# Patient Record
Sex: Female | Born: 1975 | Race: Black or African American | Hispanic: No | Marital: Single | Smoking: Former smoker
Health system: Southern US, Community
[De-identification: ages and names within clinical notes are randomized; demographics above are authoritative.]

## PROBLEM LIST (undated history)

## (undated) DIAGNOSIS — F419 Anxiety disorder, unspecified: Secondary | ICD-10-CM

## (undated) DIAGNOSIS — E7800 Pure hypercholesterolemia, unspecified: Secondary | ICD-10-CM

## (undated) DIAGNOSIS — D649 Anemia, unspecified: Secondary | ICD-10-CM

## (undated) DIAGNOSIS — E119 Type 2 diabetes mellitus without complications: Secondary | ICD-10-CM

## (undated) DIAGNOSIS — I10 Essential (primary) hypertension: Secondary | ICD-10-CM

## (undated) DIAGNOSIS — I82409 Acute embolism and thrombosis of unspecified deep veins of unspecified lower extremity: Secondary | ICD-10-CM

## (undated) HISTORY — DX: Pure hypercholesterolemia, unspecified: E78.00

## (undated) HISTORY — DX: Anemia, unspecified: D64.9

## (undated) HISTORY — DX: Acute embolism and thrombosis of unspecified deep veins of unspecified lower extremity: I82.409

## (undated) HISTORY — DX: Type 2 diabetes mellitus without complications: E11.9

## (undated) HISTORY — PX: TUBAL LIGATION: SHX77

## (undated) HISTORY — DX: Essential (primary) hypertension: I10

---

## 1997-01-27 HISTORY — PX: TUBAL LIGATION: SHX77

## 2009-10-22 ENCOUNTER — Emergency Department (HOSPITAL_COMMUNITY): Admission: EM | Admit: 2009-10-22 | Discharge: 2009-10-22 | Payer: Self-pay | Admitting: Emergency Medicine

## 2010-07-07 ENCOUNTER — Emergency Department (HOSPITAL_COMMUNITY): Payer: Self-pay

## 2010-07-07 ENCOUNTER — Emergency Department (HOSPITAL_COMMUNITY)
Admission: EM | Admit: 2010-07-07 | Discharge: 2010-07-07 | Disposition: A | Discharge status: Home / Self Care | Payer: Self-pay | Attending: Emergency Medicine | Admitting: Emergency Medicine

## 2010-07-07 DIAGNOSIS — N949 Unspecified condition associated with female genital organs and menstrual cycle: Secondary | ICD-10-CM | POA: Insufficient documentation

## 2010-07-07 DIAGNOSIS — R109 Unspecified abdominal pain: Secondary | ICD-10-CM | POA: Insufficient documentation

## 2010-07-07 LAB — URINALYSIS, ROUTINE W REFLEX MICROSCOPIC
Glucose, UA: NEGATIVE mg/dL
Hgb urine dipstick: NEGATIVE
Ketones, ur: NEGATIVE mg/dL
Protein, ur: NEGATIVE mg/dL
pH: 6.5 (ref 5.0–8.0)

## 2010-07-07 LAB — WET PREP, GENITAL

## 2010-07-09 LAB — GC/CHLAMYDIA PROBE AMP, GENITAL: GC Probe Amp, Genital: NEGATIVE

## 2011-12-30 ENCOUNTER — Ambulatory Visit: Payer: BC Managed Care – PPO

## 2011-12-30 ENCOUNTER — Ambulatory Visit: Payer: BC Managed Care – PPO | Admitting: Family Medicine

## 2011-12-30 VITALS — BP 120/85 | HR 81 | Temp 98.6°F | Resp 16 | Ht 62.25 in | Wt 190.4 lb

## 2011-12-30 DIAGNOSIS — M25532 Pain in left wrist: Secondary | ICD-10-CM

## 2011-12-30 DIAGNOSIS — M7989 Other specified soft tissue disorders: Secondary | ICD-10-CM

## 2011-12-30 DIAGNOSIS — M25539 Pain in unspecified wrist: Secondary | ICD-10-CM

## 2011-12-30 LAB — POCT CBC
Granulocyte percent: 57 % (ref 37–80)
HCT, POC: 46.4 % (ref 37.7–47.9)
Hemoglobin: 13.9 g/dL (ref 12.2–16.2)
Lymph, poc: 2.9 (ref 0.6–3.4)
MCH, POC: 27 pg (ref 27–31.2)
MCHC: 30 g/dL — AB (ref 31.8–35.4)
MCV: 90.2 fL (ref 80–97)
MID (cbc): 0.5 (ref 0–0.9)
MPV: 9.5 fL (ref 0–99.8)
POC Granulocyte: 4.5 (ref 2–6.9)
POC LYMPH PERCENT: 36.9 % (ref 10–50)
POC MID %: 6.1 % (ref 0–12)
Platelet Count, POC: 243 10*3/uL (ref 142–424)
RBC: 5.14 M/uL (ref 4.04–5.48)
RDW, POC: 14.6 %
WBC: 7.9 10*3/uL (ref 4.6–10.2)

## 2011-12-30 MED ORDER — IBUPROFEN 800 MG PO TABS: 800.0000 mg | ORAL_TABLET | Freq: Three times a day (TID) | ORAL | Status: DC | PRN

## 2011-12-30 MED ORDER — DOXYCYCLINE HYCLATE 100 MG PO TABS: 100.0000 mg | ORAL_TABLET | Freq: Two times a day (BID) | ORAL | Status: DC

## 2011-12-30 MED ORDER — TRAMADOL HCL 50 MG PO TABS: 50.0000 mg | ORAL_TABLET | Freq: Four times a day (QID) | ORAL | Status: DC | PRN

## 2011-12-30 NOTE — Progress Notes (Signed)
[facility]:  Office Visit  Chief Complaint:  Chief Complaint  Patient presents with  . Wrist Pain    left forearm and wrist pain started yesterday also swelling denies any injury    HPI: Barbara Thornton is a 36 y.o. female who complains of  Left wrist and forearm pain, swelling x 1 day; woke up this AM with pain, started yesterday, took 2 ibuprofen without relief.  NKI, denies insect bites, gout, arthritis, Lupus, recent URI/illness. Sleeps with wrist angulated at night. Denies Carpal Tunnel. No fevers, chills. +Warm, swollen.  History reviewed. No pertinent past medical history. Past Surgical History  Procedure Date  . Cesarean section    History   Social History  . Marital Status: Married    Spouse Name: N/A    Number of Children: N/A  . Years of Education: N/A   Social History Main Topics  . Smoking status: Current Every Day Smoker -- 0.5 packs/day for 17 years  . Smokeless tobacco: None  . Alcohol Use: Yes  . Drug Use: No  . Sexually Active: None   Other Topics Concern  . None   Social History Narrative  . None   Family History  Problem Relation Age of Onset  . Hypertension Mother    No Known Allergies Prior to Admission medications   Medication Sig Start Date End Date Taking? Authorizing Provider  ibuprofen (ADVIL,MOTRIN) 200 MG tablet Take 200 mg by mouth every 6 (six) hours as needed.   Yes Historical Provider, MD     ROS: The patient denies fevers, chills, night sweats, unintentional weight loss, chest pain, palpitations, wheezing, dyspnea on exertion, nausea, vomiting, abdominal pain, dysuria, hematuria, melena, numbness, weakness, or tingling.   All other systems have been reviewed and were otherwise negative with the exception of those mentioned in the HPI and as above.    PHYSICAL EXAM: Filed Vitals:   12/30/11 1346  BP: 120/85  Pulse: 81  Temp: 98.6 F (37 C)  Resp: 16   Filed Vitals:   12/30/11 1346  Height: 5' 2.25"  (1.581 m)  Weight: 190 lb 6.4 oz (86.365 kg)   Body mass index is 34.55 kg/(m^2).  General: Alert, no acute distress HEENT:  Normocephalic, atraumatic, oropharynx patent.  Cardiovascular:  Regular rate and rhythm, no rubs murmurs or gallops.  No Carotid bruits, radial pulse intact. No pedal edema.  Respiratory: Clear to auscultation bilaterally.  No wheezes, rales, or rhonchi.  No cyanosis, no use of accessory musculature GI: No organomegaly, abdomen is soft and non-tender, positive bowel sounds.  No masses. Skin: No rashes. Neurologic: Facial musculature symmetric. Psychiatric: Patient is appropriate throughout our interaction. Lymphatic: No cervical lymphadenopathy Musculoskeletal: Gait intact. Left wrist- swollen , warm, + radial artery, pain with ROM in all directions  5/5 strength, sensation intact, swelling on dorsum of wrist base of thumb over MCP   LABS: Results for orders placed in visit on 12/30/11  POCT CBC      Component Value Range   WBC 7.9  4.6 - 10.2 K/uL   Lymph, poc 2.9  0.6 - 3.4   POC LYMPH PERCENT 36.9  10 - 50 %L   MID (cbc) 0.5  0 - 0.9   POC MID % 6.1  0 - 12 %M   POC Granulocyte 4.5  2 - 6.9   Granulocyte percent 57.0  37 - 80 %G   RBC 5.14  4.04 - 5.48 M/uL   Hemoglobin 13.9  12.2 -  16.2 g/dL   HCT, POC 95.6  21.3 - 47.9 %   MCV 90.2  80 - 97 fL   MCH, POC 27.0  27 - 31.2 pg   MCHC 30.0 (*) 31.8 - 35.4 g/dL   RDW, POC 08.6     Platelet Count, POC 243  142 - 424 K/uL   MPV 9.5  0 - 99.8 fL     EKG/XRAY:   Primary read interpreted by Dr. Conley Rolls at [facility]. No fx/subluxation + soft tissue swelling   ASSESSMENT/PLAN: Encounter Diagnoses  Name Primary?  . Left wrist pain Yes  . Left arm swelling    Cellulitis vs tenosynovitis vs reactive arthritis vs less likely septic joint Rx thumb spica wrist splint Rx Doxycycline for possible cellulitis prevention Rx Ibuprofen and Tramadol prn swelling, pain F/u in 48 hrs or prn    LE, THAO PHUONG,  DO 12/30/2011 4:03 PM

## 2013-01-19 ENCOUNTER — Encounter (HOSPITAL_COMMUNITY): Payer: Self-pay | Admitting: Emergency Medicine

## 2013-01-19 ENCOUNTER — Emergency Department (HOSPITAL_COMMUNITY)
Admission: EM | Admit: 2013-01-19 | Discharge: 2013-01-20 | Disposition: A | Discharge status: Home / Self Care | Payer: BC Managed Care – PPO | Attending: Emergency Medicine | Admitting: Emergency Medicine

## 2013-01-19 DIAGNOSIS — M7989 Other specified soft tissue disorders: Secondary | ICD-10-CM | POA: Insufficient documentation

## 2013-01-19 DIAGNOSIS — F17200 Nicotine dependence, unspecified, uncomplicated: Secondary | ICD-10-CM | POA: Insufficient documentation

## 2013-01-19 DIAGNOSIS — M79671 Pain in right foot: Secondary | ICD-10-CM

## 2013-01-19 DIAGNOSIS — M25579 Pain in unspecified ankle and joints of unspecified foot: Secondary | ICD-10-CM | POA: Insufficient documentation

## 2013-01-19 NOTE — ED Notes (Signed)
Pt arrived to the ED with a complaint of right foot pain.  Pt states pain and swelling began around 1645 this evening.  Pt states she did noting to cause the pain and swelling.  Pt's foot slightly swollen.

## 2013-01-20 ENCOUNTER — Emergency Department (HOSPITAL_COMMUNITY): Payer: BC Managed Care – PPO

## 2013-01-20 LAB — CBC WITH DIFFERENTIAL/PLATELET
Basophils Absolute: 0 10*3/uL (ref 0.0–0.1)
Eosinophils Relative: 1 % (ref 0–5)
HCT: 36.8 % (ref 36.0–46.0)
Hemoglobin: 12.4 g/dL (ref 12.0–15.0)
Lymphocytes Relative: 32 % (ref 12–46)
MCV: 84.2 fL (ref 78.0–100.0)
Monocytes Absolute: 0.6 10*3/uL (ref 0.1–1.0)
Monocytes Relative: 7 % (ref 3–12)
Neutro Abs: 5.1 10*3/uL (ref 1.7–7.7)
RDW: 14.3 % (ref 11.5–15.5)
WBC: 8.5 10*3/uL (ref 4.0–10.5)

## 2013-01-20 LAB — COMPREHENSIVE METABOLIC PANEL
BUN: 12 mg/dL (ref 6–23)
CO2: 25 mEq/L (ref 19–32)
Calcium: 9.4 mg/dL (ref 8.4–10.5)
Chloride: 102 mEq/L (ref 96–112)
Creatinine, Ser: 0.67 mg/dL (ref 0.50–1.10)
GFR calc Af Amer: >90 mL/min (ref >90)
GFR calc non Af Amer: >90 mL/min (ref >90)
Glucose, Bld: 98 mg/dL (ref 70–99)
Total Bilirubin: 0.6 mg/dL (ref 0.3–1.2)

## 2013-01-20 LAB — URIC ACID: Uric Acid, Serum: 6 mg/dL (ref 2.4–7.0)

## 2013-01-20 MED ORDER — HYDROCODONE-ACETAMINOPHEN 5-325 MG PO TABS: 1.0000 | ORAL_TABLET | ORAL | Status: DC | PRN

## 2013-01-20 MED ORDER — CLINDAMYCIN HCL 300 MG PO CAPS
300.0000 mg | ORAL_CAPSULE | Freq: Once | ORAL | Status: AC
  Administered 2013-01-20: 300 mg via ORAL
  Filled 2013-01-20: qty 1

## 2013-01-20 MED ORDER — KETOROLAC TROMETHAMINE 60 MG/2ML IM SOLN
60.0000 mg | Freq: Once | INTRAMUSCULAR | Status: AC
  Administered 2013-01-20: 60 mg via INTRAMUSCULAR
  Filled 2013-01-20: qty 2

## 2013-01-20 MED ORDER — IBUPROFEN 600 MG PO TABS: 600.0000 mg | ORAL_TABLET | Freq: Four times a day (QID) | ORAL | Status: DC | PRN

## 2013-01-20 MED ORDER — CLINDAMYCIN HCL 300 MG PO CAPS: 300.0000 mg | ORAL_CAPSULE | Freq: Four times a day (QID) | ORAL | Status: DC

## 2013-01-20 NOTE — ED Provider Notes (Signed)
CSN: [id number]     Arrival date & time 01/19/13  2252 History   First MD Initiated Contact with Patient 01/20/13 0205     Chief Complaint  Patient presents with  . Extremity Pain   (Consider location/radiation/quality/duration/timing/severity/associated sxs/prior Treatment) HPI Patient presents with right foot pain that began around 445 this past evening the patient had no known trauma. She's had increased swelling, warmth and tenderness to the dorsum of the foot. She's had no calf tenderness or swelling. She denies any red streaking or groin pain. He's had no fevers or chills. Patient has no previous similar episodes History reviewed. No pertinent past medical history. Past Surgical History  Procedure Laterality Date  . Cesarean section     Family History  Problem Relation Age of Onset  . Hypertension Mother    History  Substance Use Topics  . Smoking status: Current Every Day Smoker -- 0.50 packs/day for 17 years  . Smokeless tobacco: Not on file  . Alcohol Use: Yes   OB History   Grav Para Term Preterm Abortions TAB SAB Ect Mult Living                 Review of Systems  Constitutional: Negative for fever and chills.  Respiratory: Negative for shortness of breath.   Cardiovascular: Positive for leg swelling. Negative for chest pain.  Gastrointestinal: Negative for nausea, vomiting, abdominal pain, diarrhea and constipation.  Musculoskeletal: Negative for back pain, neck pain and neck stiffness.  Skin: Positive for color change. Negative for rash and wound.  Neurological: Negative for dizziness, syncope, weakness, light-headedness, numbness and headaches.  All other systems reviewed and are negative.    Allergies  Review of patient's allergies indicates no known allergies.  Home Medications   Current Outpatient Rx  Name  Route  Sig  Dispense  Refill  . clindamycin (CLEOCIN) 300 MG capsule   Oral   Take 1 capsule (300 mg total) by mouth 4 (four) times daily. X 7  days   28 capsule   0   . HYDROcodone-acetaminophen (NORCO) 5-325 MG per tablet   Oral   Take 1 tablet by mouth every 4 (four) hours as needed.   20 tablet   0   . ibuprofen (ADVIL,MOTRIN) 600 MG tablet   Oral   Take 1 tablet (600 mg total) by mouth every 6 (six) hours as needed.   30 tablet   0    BP 135/79  Pulse 76  Temp(Src) 98.5 F (36.9 C) (Oral)  Resp 16  Ht 5\' 2"  (1.575 m)  SpO2 100%  LMP 01/19/2013 Physical Exam  Nursing note and vitals reviewed. Constitutional: She is oriented to person, place, and time. She appears well-developed and well-nourished. No distress.  HENT:  Head: Normocephalic and atraumatic.  Mouth/Throat: Oropharynx is clear and moist.  Eyes: EOM are normal. Pupils are equal, round, and reactive to light.  Neck: Normal range of motion. Neck supple.  Cardiovascular: Normal rate and regular rhythm.   Pulmonary/Chest: Effort normal and breath sounds normal. No respiratory distress. She has no wheezes. She has no rales. She exhibits no tenderness.  Abdominal: Soft. Bowel sounds are normal. She exhibits no distension and no mass. There is no tenderness. There is no rebound and no guarding.  Musculoskeletal: Normal range of motion. She exhibits tenderness. She exhibits no edema.  Swelling, erythema and warmth to the dorsum of the right foot which is also diffusely tender in this area. No open wounds identified. No definite  tenderness in the ankle or metatarsal joints  Neurological: She is alert and oriented to person, place, and time.  Patient is alert and oriented x3 with clear, goal oriented speech. Patient has 5/5 motor in all extremities. Sensation is intact to light touch.  Skin: Skin is warm and dry. No rash noted. There is erythema.  Psychiatric: She has a normal mood and affect. Her behavior is normal.    ED Course  Procedures (including critical care time) Labs Review Labs Reviewed  CBC WITH DIFFERENTIAL  COMPREHENSIVE METABOLIC PANEL   URIC ACID   Imaging Review Dg Foot Complete Right  01/20/2013   CLINICAL DATA:  Pain. Redness and swelling on the anterior aspect of foot.  EXAM: RIGHT FOOT COMPLETE - 3+ VIEW  COMPARISON:  None.  FINDINGS: There is no evidence of fracture or dislocation. No focal osseous lesion. Degenerative spurring present at the dorsal aspect of the talonavicular joint. Calcaneal enthesophyte is noted.  There is mild soft tissue swelling at the dorsal aspect of the right midfoot. No emphysema or radiopaque foreign body.  IMPRESSION: Mild soft tissue swelling at the dorsal aspect of the right midfoot. No acute osseous abnormality. No soft tissue emphysema radiopaque foreign body.   Electronically Signed   By: Rise Mu M.D.   On: 01/20/2013 03:28    EKG Interpretation   None       MDM   Question of acute inflammation versus infectious process. Patient has no constitutional symptoms. White blood cell count is normal. Will start on clindamycin have patient followup in 24-48 hours. She's been given return precautions to return immediately for fever, worsening pain, increased swelling up the leg or for any concerns.   Loren Racer, MD 01/21/13 [phone]

## 2013-01-20 NOTE — ED Notes (Signed)
MD at bedside. 

## 2014-11-28 ENCOUNTER — Ambulatory Visit (INDEPENDENT_AMBULATORY_CARE_PROVIDER_SITE_OTHER): Payer: BLUE CROSS/BLUE SHIELD | Admitting: Emergency Medicine

## 2014-11-28 VITALS — BP 116/86 | HR 83 | Temp 98.7°F | Resp 18 | Ht 62.5 in | Wt 196.2 lb

## 2014-11-28 DIAGNOSIS — J020 Streptococcal pharyngitis: Secondary | ICD-10-CM | POA: Diagnosis not present

## 2014-11-28 MED ORDER — HYDROCODONE-ACETAMINOPHEN 5-325 MG PO TABS: 1.0000 | ORAL_TABLET | ORAL | Status: DC | PRN

## 2014-11-28 MED ORDER — PENICILLIN V POTASSIUM 500 MG PO TABS: 500.0000 mg | ORAL_TABLET | Freq: Four times a day (QID) | ORAL | Status: DC

## 2014-11-28 NOTE — Patient Instructions (Signed)

## 2014-11-28 NOTE — Progress Notes (Signed)
Subjective:  Patient ID: Barbara Thornton, female    DOB: 14-May-1975  Age: 39 y.o. MRN: [id number]  CC: Sore Throat   HPI Barbara Thornton presents  patient has a sore throat fever and occasional cough. Barbara Thornton has no nasal congestion postnasal drainage or cough is nonproductive and there is no associated wheezing or shortness of breath. Her sore throat began yesterday. One of her coworkers also ill with similar symptoms.  History Barbara Thornton has no past medical history on file.   Barbara Thornton has past surgical history that includes Cesarean section.   Her  family history includes Hypertension in her mother.  Barbara Thornton   reports that Barbara Thornton has quit smoking. Barbara Thornton quit smokeless tobacco use about 2 years ago. Barbara Thornton reports that Barbara Thornton drinks alcohol. Barbara Thornton reports that Barbara Thornton does not use illicit drugs.  Outpatient Prescriptions Prior to Visit  Medication Sig Dispense Refill  . clindamycin (CLEOCIN) 300 MG capsule Take 1 capsule (300 mg total) by mouth 4 (four) times daily. X 7 days 28 capsule 0  . HYDROcodone-acetaminophen (NORCO) 5-325 MG per tablet Take 1 tablet by mouth every 4 (four) hours as needed. 20 tablet 0  . ibuprofen (ADVIL,MOTRIN) 600 MG tablet Take 1 tablet (600 mg total) by mouth every 6 (six) hours as needed. 30 tablet 0   No facility-administered medications prior to visit.    Social History   Social History  . Marital Status: Married    Spouse Name: N/A  . Number of Children: N/A  . Years of Education: N/A   Social History Main Topics  . Smoking status: Former Smoker -- 0.50 packs/day for 17 years  . Smokeless tobacco: Former Systems developer    Quit date: 11/27/2012  . Alcohol Use: 0.0 oz/week    0 Standard drinks or equivalent per week  . Drug Use: No  . Sexual Activity: Not Asked   Other Topics Concern  . None   Social History Narrative     Review of Systems  Constitutional: Positive for fever. Negative for chills and appetite change.  HENT: Positive for sore throat. Negative for  congestion, ear pain, postnasal drip and sinus pressure.   Eyes: Negative for pain and redness.  Respiratory: Positive for cough. Negative for shortness of breath and wheezing.   Cardiovascular: Negative for leg swelling.  Gastrointestinal: Negative for nausea, vomiting, abdominal pain, diarrhea, constipation and blood in stool.  Endocrine: Negative for polyuria.  Genitourinary: Negative for dysuria, urgency, frequency and flank pain.  Musculoskeletal: Negative for gait problem.  Skin: Negative for rash.  Neurological: Negative for weakness and headaches.  Psychiatric/Behavioral: Negative for confusion and decreased concentration. The patient is not nervous/anxious.     Objective:  BP 116/86 mmHg  Pulse 83  Temp(Src) 98.7 F (37.1 C) (Oral)  Resp 18  Ht 5' 2.5" (1.588 m)  Wt 196 lb 3.2 oz (88.996 kg)  BMI 35.29 kg/m2  SpO2 99%  Physical Exam  Constitutional: Barbara Thornton is oriented to person, place, and time. Barbara Thornton appears well-developed and well-nourished. No distress.  HENT:  Head: Normocephalic and atraumatic.  Right Ear: External ear normal.  Left Ear: External ear normal.  Nose: Nose normal.  Mouth/Throat: Oropharyngeal exudate, posterior oropharyngeal edema and posterior oropharyngeal erythema present.  Eyes: Conjunctivae and EOM are normal. Pupils are equal, round, and reactive to light. No scleral icterus.  Neck: Normal range of motion. Neck supple. No tracheal deviation present.  Cardiovascular: Normal rate, regular rhythm and normal heart sounds.   Pulmonary/Chest: Effort normal. No  respiratory distress. Barbara Thornton has no wheezes. Barbara Thornton has no rales.  Abdominal: Barbara Thornton exhibits no mass. There is no tenderness. There is no rebound and no guarding.  Musculoskeletal: Barbara Thornton exhibits no edema.  Lymphadenopathy:    Barbara Thornton has no cervical adenopathy.  Neurological: Barbara Thornton is alert and oriented to person, place, and time. Coordination normal.  Skin: Skin is warm and dry. No rash noted.  Psychiatric:  Barbara Thornton has a normal mood and affect. Her behavior is normal.      Assessment & Plan:   Barbara Thornton was seen today for sore throat.  Diagnoses and all orders for this visit:  Streptococcal sore throat  Other orders -     penicillin v potassium (VEETID) 500 MG tablet; Take 1 tablet (500 mg total) by mouth 4 (four) times daily. -     HYDROcodone-acetaminophen (NORCO) 5-325 MG tablet; Take 1-2 tablets by mouth every 4 (four) hours as needed.  I have discontinued Barbara Thornton ibuprofen, HYDROcodone-acetaminophen, and clindamycin. I am also having her start on penicillin v potassium and HYDROcodone-acetaminophen.  Meds ordered this encounter  Medications  . penicillin v potassium (VEETID) 500 MG tablet    Sig: Take 1 tablet (500 mg total) by mouth 4 (four) times daily.    Dispense:  40 tablet    Refill:  0  . HYDROcodone-acetaminophen (NORCO) 5-325 MG tablet    Sig: Take 1-2 tablets by mouth every 4 (four) hours as needed.    Dispense:  30 tablet    Refill:  0    Appropriate red flag conditions were discussed with the patient as well as actions that should be taken.  Patient expressed his understanding.  Follow-up: Return if symptoms worsen or fail to improve.  Roselee Culver, MD

## 2015-02-27 ENCOUNTER — Encounter: Payer: BLUE CROSS/BLUE SHIELD | Admitting: Obstetrics and Gynecology

## 2015-03-07 ENCOUNTER — Telehealth: Payer: Self-pay | Admitting: Obstetrics and Gynecology

## 2015-03-07 ENCOUNTER — Ambulatory Visit (INDEPENDENT_AMBULATORY_CARE_PROVIDER_SITE_OTHER): Payer: BLUE CROSS/BLUE SHIELD | Admitting: Obstetrics and Gynecology

## 2015-03-07 ENCOUNTER — Encounter: Payer: Self-pay | Admitting: Obstetrics and Gynecology

## 2015-03-07 VITALS — BP 138/80 | HR 80 | Resp 14 | Ht 62.5 in | Wt 195.0 lb

## 2015-03-07 DIAGNOSIS — Z23 Encounter for immunization: Secondary | ICD-10-CM

## 2015-03-07 DIAGNOSIS — Z0000 Encounter for general adult medical examination without abnormal findings: Secondary | ICD-10-CM

## 2015-03-07 DIAGNOSIS — Z9289 Personal history of other medical treatment: Secondary | ICD-10-CM

## 2015-03-07 DIAGNOSIS — Z01419 Encounter for gynecological examination (general) (routine) without abnormal findings: Secondary | ICD-10-CM | POA: Diagnosis not present

## 2015-03-07 DIAGNOSIS — N926 Irregular menstruation, unspecified: Secondary | ICD-10-CM | POA: Diagnosis not present

## 2015-03-07 DIAGNOSIS — Z9189 Other specified personal risk factors, not elsewhere classified: Secondary | ICD-10-CM

## 2015-03-07 DIAGNOSIS — Z113 Encounter for screening for infections with a predominantly sexual mode of transmission: Secondary | ICD-10-CM | POA: Diagnosis not present

## 2015-03-07 DIAGNOSIS — Z124 Encounter for screening for malignant neoplasm of cervix: Secondary | ICD-10-CM

## 2015-03-07 DIAGNOSIS — Z2839 Other underimmunization status: Secondary | ICD-10-CM

## 2015-03-07 LAB — COMPREHENSIVE METABOLIC PANEL
ALBUMIN: 3.9 g/dL (ref 3.6–5.1)
ALK PHOS: 46 U/L (ref 33–115)
ALT: 12 U/L (ref 6–29)
AST: 14 U/L (ref 10–30)
BUN: 11 mg/dL (ref 7–25)
CO2: 22 mmol/L (ref 20–31)
Calcium: 9.5 mg/dL (ref 8.6–10.2)
Chloride: 105 mmol/L (ref 98–110)
Creat: 0.74 mg/dL (ref 0.50–1.10)
Glucose, Bld: 93 mg/dL (ref 65–99)
POTASSIUM: 4.6 mmol/L (ref 3.5–5.3)
Sodium: 139 mmol/L (ref 135–146)
TOTAL PROTEIN: 7.4 g/dL (ref 6.1–8.1)
Total Bilirubin: 0.5 mg/dL (ref 0.2–1.2)

## 2015-03-07 LAB — LIPID PANEL
CHOLESTEROL: 133 mg/dL (ref 125–200)
HDL: 53 mg/dL (ref >=46)
LDL CALC: 65 mg/dL (ref <130)
TRIGLYCERIDES: 75 mg/dL (ref <150)
Total CHOL/HDL Ratio: 2.5 Ratio (ref <=5.0)
VLDL: 15 mg/dL (ref <30)

## 2015-03-07 LAB — CBC
HCT: 36.7 % (ref 36.0–46.0)
HEMOGLOBIN: 11.6 g/dL — AB (ref 12.0–15.0)
MCH: 26.3 pg (ref 26.0–34.0)
MCHC: 31.6 g/dL (ref 30.0–36.0)
MCV: 83.2 fL (ref 78.0–100.0)
MPV: 10 fL (ref 8.6–12.4)
Platelets: 303 10*3/uL (ref 150–400)
RBC: 4.41 MIL/uL (ref 3.87–5.11)
RDW: 14.9 % (ref 11.5–15.5)
WBC: 5.4 10*3/uL (ref 4.0–10.5)

## 2015-03-07 LAB — TSH: TSH: 1.59 m[IU]/L

## 2015-03-07 NOTE — Patient Instructions (Signed)

## 2015-03-07 NOTE — Addendum Note (Signed)
Addended by: Dorothy Spark on: 03/07/2015 02:10 PM   Modules accepted: Orders

## 2015-03-07 NOTE — Progress Notes (Addendum)
Patient ID: Barbara Thornton, female   DOB: January 06, 1976, 40 y.o.   MRN: [id number] 40 y.o. [id number] MarriedAfrican AmericanF here for annual exam. Last menstrual cycle was different and last longer which concerned patient. Period Cycle (Days): 28 Period Duration (Days): 4 days  Period Pattern: Regular Menstrual Flow: Moderate Menstrual Control: Tampon Dysmenorrhea: (!) Mild Dysmenorrhea Symptoms: Cramping  Saturates a super tampon in 4 hours. Cramps are mild to moderate. Not taking medication. Her last cycle lasted 7 days, spotting for the last 3 days, had an odor. No longer having an odor.  Sexually active, no pain. TL for contraception.   Patient's last menstrual period was 02/12/2015.          Sexually active: Yes.    The current method of family planning is tubal ligation.    Exercising: Yes.    cardio Smoker:  former  Health Maintenance: Pap:  10 years ago  History of abnormal Pap:  no MMG:  Never Colonoscopy:  Never BMD:   Never TDaP:  unsure Gardasil: N/A   reports that she has quit smoking. She quit smokeless tobacco use about 2 years ago. She reports that she drinks alcohol. She reports that she does not use illicit drugs.Daughters are 78 and 48. She is in the process of divorce and is in another relationship. She is a Public relations account executive.   History reviewed. No pertinent past medical history.  Past Surgical History  Procedure Laterality Date  . Cesarean section    . Tubal ligation    1 vaginal delivery, h/o ectopic, tube removed, c/s x 1 with tubal (of the remaining tube)  No current outpatient prescriptions on file.   No current facility-administered medications for this visit.    Family History  Problem Relation Age of Onset  . Hypertension Mother     Review of Systems  Constitutional: Negative.   HENT: Negative.   Eyes: Negative.   Respiratory: Negative.   Cardiovascular: Negative.   Gastrointestinal: Negative.   Endocrine: Negative.   Genitourinary:  Negative.        Irregular menstrual cycle with last cycle  Musculoskeletal: Negative.   Skin: Negative.   Allergic/Immunologic: Negative.   Neurological: Negative.   Psychiatric/Behavioral: Negative.     Exam:   BP 138/80 mmHg  Pulse 80  Resp 14  Ht 5' 2.5" (1.588 m)  Wt 195 lb (88.451 kg)  BMI 35.08 kg/m2  LMP 02/12/2015  Weight change: @WEIGHTCHANGE @ Height:   Height: 5' 2.5" (158.8 cm)  Ht Readings from Last 3 Encounters:  03/07/15 5' 2.5" (1.588 m)  11/28/14 5' 2.5" (1.588 m)  01/19/13 5\' 2"  (1.575 m)    General appearance: alert, cooperative and appears stated age Head: Normocephalic, without obvious abnormality, atraumatic Neck: no adenopathy, supple, symmetrical, trachea midline and thyroid normal to inspection and palpation Lungs: clear to auscultation bilaterally Breasts: normal appearance, no masses or tenderness Heart: regular rate and rhythm Abdomen: soft, non-tender; bowel sounds normal; no masses,  no organomegaly Extremities: extremities normal, atraumatic, no cyanosis or edema Skin: Skin color, texture, turgor normal. No rashes or lesions Lymph nodes: Cervical, supraclavicular, and axillary nodes normal. No abnormal inguinal nodes palpated Neurologic: Grossly normal   Pelvic: External genitalia:  no lesions              Urethra:  normal appearing urethra with no masses, tenderness or lesions              Bartholins and Skenes: normal  Vagina: normal appearing vagina with normal color and discharge, no lesions              Cervix: no lesions               Bimanual Exam:  Uterus:  normal size, contour, position, consistency, mobility, non-tender, anteverted              Adnexa: no mass, fullness, tenderness               Rectovaginal: Confirms               Anus:  normal sphincter tone, no lesions  Chaperone was present for exam.  A:  Well Woman with normal exam  One abnormally long cycle, lasted x 7 days.  P:    Pap with hpv  TSH,  CBC, CMP, lipids, vit D  Mammogram after she turns 40  Calendar cycles, discussed when to call  Discussed calcium, vit D and breast self exams  TDAP  Addendum: patient desires STD testing, added.

## 2015-03-07 NOTE — Telephone Encounter (Signed)
Called patient, left a message for her to call

## 2015-03-08 LAB — STD PANEL
HEP B S AG: NEGATIVE
HIV: NONREACTIVE

## 2015-03-08 LAB — VITAMIN D 25 HYDROXY (VIT D DEFICIENCY, FRACTURES): VIT D 25 HYDROXY: 11 ng/mL — AB (ref 30–100)

## 2015-03-08 LAB — HEPATITIS C ANTIBODY: HCV AB: NEGATIVE

## 2015-03-09 ENCOUNTER — Telehealth: Payer: Self-pay

## 2015-03-09 DIAGNOSIS — R7989 Other specified abnormal findings of blood chemistry: Secondary | ICD-10-CM

## 2015-03-09 DIAGNOSIS — E611 Iron deficiency: Secondary | ICD-10-CM

## 2015-03-09 MED ORDER — VITAMIN D (ERGOCALCIFEROL) 1.25 MG (50000 UNIT) PO CAPS: 50000.0000 [IU] | ORAL_CAPSULE | ORAL | Status: DC

## 2015-03-09 NOTE — Telephone Encounter (Signed)
-----   Message from [name], MD sent at 03/09/2015  9:10 AM EST ----- Please inform the patient that her vit d level is very low and she is mildly anemic. Please call in 50,000 IU po q week x 12 weeks for the patient and ask her to start on one iron tab a day (ie slow fe or ferrous gluconate 324-325 mg a day). Then she needs a f/u vit d, cbc and ferritin in 3 months (please set up). Her pap and genprobe are pending.

## 2015-03-09 NOTE — Addendum Note (Signed)
Addended by: Jasmine Awe on: 03/09/2015 02:07 PM   Modules accepted: Orders

## 2015-03-09 NOTE — Telephone Encounter (Signed)
Spoke with patient. Advised of results as seen below from [place]. She is agreeable and verbalizes understanding. Rx for Vitamin D 50,000 IU take 1 tablet every 7 days x 12 weeks #12 0RF sent to pharmacy on file. Follow up lab appointment scheduled for 06/14/2015 at 8:30 am. She is agreeable to date and time. Future orders placed for vitamin d, cbc, and ferritin level.  Routing to provider for final review. Patient agreeable to disposition. Will close encounter.

## 2015-03-12 LAB — IPS PAP TEST WITH HPV

## 2015-03-12 LAB — IPS N GONORRHOEA AND CHLAMYDIA BY PCR

## 2015-03-14 ENCOUNTER — Telehealth: Payer: Self-pay

## 2015-03-14 NOTE — Telephone Encounter (Signed)
Patient returning call.  Ok to leave a detailed message.

## 2015-03-14 NOTE — Telephone Encounter (Signed)
Detailed message left at phone number provided [phone], okay per ROI. Advised of results as seen below from [place]. Advised to return call to discuss if she is or is not having any symptoms to determine if treatment is needed. If she is symptomatic would she like to have Flagyl or Metrogel?

## 2015-03-14 NOTE — Telephone Encounter (Signed)
Spoke with patient. Advised of results as seen below from [place]. She denies any current vaginal symptoms. Advised no treatment is needed at this time. If she develops and symptoms she will need to contact the office for rx. She is agreeable. States she has a "knot" under the skin where she received her Tdap vaccination while in the office. Denies any rash, tightness in the arm, or arm swelling. Advised "knot" can be from penetration of the skin from the injection. Advised if she develops any new symptoms such as arm swelling, tightness, rash, SOB, will need to notify the office immediately. She is agreeable.  Routing to provider for final review. Patient agreeable to disposition. Will close encounter.

## 2015-03-14 NOTE — Telephone Encounter (Signed)
-----   Message from [name], MD sent at 03/14/2015 10:14 AM EST ----- Please also inform of negative GC/Chlamydia testing

## 2015-03-14 NOTE — Telephone Encounter (Signed)
Left message to call [place] at [phone].  Notes Recorded by [name], MD on 03/14/2015 at 10:13 AM 02 Please inform the patient that her pap was + for BV and only if she is symptomatic, treat with flagyl (either oral or vaginal, her choice), no ETOH while on Flagyl.  Oral: Flagyl 500 mg BID x 7 days, or Vaginal: Metrogel, 1 applicator per vagina q day x 5 days.

## 2015-05-26 ENCOUNTER — Other Ambulatory Visit: Payer: Self-pay | Admitting: Obstetrics and Gynecology

## 2015-05-28 NOTE — Telephone Encounter (Signed)
Medication refill request: Vitamin D 50,000 iu's Last AEX:  03/07/15 with JJ Next AEX: 03/12/16 with JJ Last Vitamin D level: 03/07/15 at 11 Refill authorized: Please advise  According to last vitamin d level checked patient was to start on vitamin d 50,000 iu's x12 weeks and then have a 3 month recheck. Patient is scheduled for vitamin d, cbc and ferritin recheck 06/14/15, please advise.

## 2015-05-28 NOTE — Telephone Encounter (Signed)
Vitamin D 50,000 iu's #4/0 rfs sent to [facility].

## 2015-05-28 NOTE — Telephone Encounter (Signed)
Please give her a one month refill

## 2015-06-14 ENCOUNTER — Other Ambulatory Visit (INDEPENDENT_AMBULATORY_CARE_PROVIDER_SITE_OTHER): Payer: BLUE CROSS/BLUE SHIELD

## 2015-06-14 DIAGNOSIS — E611 Iron deficiency: Secondary | ICD-10-CM

## 2015-06-14 DIAGNOSIS — R7989 Other specified abnormal findings of blood chemistry: Secondary | ICD-10-CM

## 2015-06-14 LAB — CBC WITH DIFFERENTIAL/PLATELET
BASOS ABS: 0 {cells}/uL (ref 0–200)
Basophils Relative: 0 %
EOS ABS: 65 {cells}/uL (ref 15–500)
Eosinophils Relative: 1 %
HEMATOCRIT: 36.4 % (ref 35.0–45.0)
Hemoglobin: 11.4 g/dL — ABNORMAL LOW (ref 11.7–15.5)
Lymphocytes Relative: 38 %
Lymphs Abs: 2470 cells/uL (ref 850–3900)
MCH: 26.8 pg — AB (ref 27.0–33.0)
MCHC: 31.3 g/dL — AB (ref 32.0–36.0)
MCV: 85.6 fL (ref 80.0–100.0)
MONOS PCT: 7 %
MPV: 9.6 fL (ref 7.5–12.5)
Monocytes Absolute: 455 cells/uL (ref 200–950)
NEUTROS ABS: 3510 {cells}/uL (ref 1500–7800)
Neutrophils Relative %: 54 %
PLATELETS: 306 10*3/uL (ref 140–400)
RBC: 4.25 MIL/uL (ref 3.80–5.10)
RDW: 14.9 % (ref 11.0–15.0)
WBC: 6.5 10*3/uL (ref 3.8–10.8)

## 2015-06-14 LAB — FERRITIN: Ferritin: 25 ng/mL (ref 10–154)

## 2015-06-15 LAB — VITAMIN D 25 HYDROXY (VIT D DEFICIENCY, FRACTURES): Vit D, 25-Hydroxy: 38 ng/mL (ref 30–100)

## 2015-06-16 ENCOUNTER — Ambulatory Visit (INDEPENDENT_AMBULATORY_CARE_PROVIDER_SITE_OTHER): Payer: BLUE CROSS/BLUE SHIELD | Admitting: Physician Assistant

## 2015-06-16 VITALS — BP 118/80 | HR 73 | Temp 98.6°F | Resp 14 | Ht 63.5 in | Wt 196.0 lb

## 2015-06-16 DIAGNOSIS — J029 Acute pharyngitis, unspecified: Secondary | ICD-10-CM | POA: Diagnosis not present

## 2015-06-16 DIAGNOSIS — J069 Acute upper respiratory infection, unspecified: Secondary | ICD-10-CM | POA: Diagnosis not present

## 2015-06-16 DIAGNOSIS — B9789 Other viral agents as the cause of diseases classified elsewhere: Secondary | ICD-10-CM

## 2015-06-16 LAB — POCT RAPID STREP A (OFFICE): RAPID STREP A SCREEN: NEGATIVE

## 2015-06-16 MED ORDER — HYDROCODONE-HOMATROPINE 5-1.5 MG/5ML PO SYRP: 2.5000 mL | ORAL_SOLUTION | Freq: Every evening | ORAL | Status: DC | PRN

## 2015-06-16 NOTE — Progress Notes (Signed)
   06/16/2015 4:12 PM   DOB: 1975/04/05 / MRN: [id number]  SUBJECTIVE:  Barbara Thornton is a 40 y.o. female presenting for sore throat that started yesterday. She denies fever, chills and is tolerating po solids and liquids.  Feels she is getting worse.  She has lost her voice.  She has tried nothing.    She has No Known Allergies.   She  has no past medical history on file.    She  reports that she has quit smoking. She quit smokeless tobacco use about 2 years ago. She reports that she drinks alcohol. She reports that she does not use illicit drugs. She  reports that she currently engages in sexual activity and has had female partners. The patient  has past surgical history that includes Cesarean section and Tubal ligation.  Her family history includes Hypertension in her mother.  Review of Systems  Constitutional: Negative for fever and chills.  HENT: Positive for congestion.   Respiratory: Positive for cough.   Cardiovascular: Negative for chest pain.  Genitourinary: Negative for dysuria.  Skin: Negative for rash.  Neurological: Negative for dizziness and headaches.    Problem list and medications reviewed and updated by myself where necessary, and exist elsewhere in the encounter.   OBJECTIVE:  BP 118/80 mmHg  Pulse 73  Temp(Src) 98.6 F (37 C) (Oral)  Resp 14  Ht 5' 3.5" (1.613 m)  Wt 196 lb (88.905 kg)  BMI 34.17 kg/m2  SpO2 99%  Physical Exam  Constitutional: She is oriented to person, place, and time.  HENT:  Right Ear: External ear normal.  Left Ear: External ear normal.  Nose: Mucosal edema present. Right sinus exhibits no maxillary sinus tenderness and no frontal sinus tenderness. Left sinus exhibits no maxillary sinus tenderness and no frontal sinus tenderness.  Mouth/Throat: Oropharynx is clear and moist. No oropharyngeal exudate.  Eyes: Conjunctivae are normal. Pupils are equal, round, and reactive to light.  Cardiovascular: Regular rhythm and normal heart  sounds.   Pulmonary/Chest: Effort normal and breath sounds normal. She has no rales.  Neurological: She is alert and oriented to person, place, and time.  Skin: Skin is warm and dry. No rash noted. She is not diaphoretic. No erythema.  Psychiatric: Her behavior is normal.    Results for orders placed or performed in visit on 06/16/15 (from the past 72 hour(s))  POCT rapid strep A     Status: Normal   Collection Time: 06/16/15  3:45 PM  Result Value Ref Range   Rapid Strep A Screen Negative Negative    No results found.  ASSESSMENT AND PLAN  Barbara Thornton was seen today for laryngitis, sore throat and cough.  Diagnoses and all orders for this visit:  Sore throat -     POCT rapid strep A -     Culture, Group A Strep    The patient was advised to call or return to clinic if she does not see an improvement in symptoms or to seek the care of the closest emergency department if she worsens with the above plan.   Philis Fendt, MHS, PA-C [facility] 06/16/2015 4:12 PM

## 2015-06-16 NOTE — Patient Instructions (Signed)
-   You have a viral upper respiratory infection, (the common cold) and it is not uncommon for symptoms to last 10 days to 2 weeks, regardless of which medications you take. Unfortunately antibiotics will not help your symptoms as they target bacteria, and you are likely suffering from a virus. Additionally, 1 in 8 people who take antibiotics suffer mild to severe side effects.    - You would benefit from high dose ibuprofen. TAKE 600-800 mg of ibuprofen every 8 hours as needed to control low grade fever, fatigue, and pain.    - I also recommend that you take Zyrtec-D 5-120 every morning for the next five to 10 days. This medication will help you with nasal congestion, post nasal drip and sneezing. You will have to purchase this directly from the pharmacist    Please visit the CDC's website [employer].com.cy to learn about appropriate and inappropriate uses of antibiotics.

## 2015-06-17 LAB — CULTURE, GROUP A STREP

## 2015-06-18 ENCOUNTER — Telehealth: Payer: Self-pay

## 2015-06-18 NOTE — Telephone Encounter (Signed)
Barbara Thornton wanted to inform pt. That her culture did grow out strep but not strep A, but if she would like we can call in amox 875 bid #20 tabs if she would like some medication but if she is doing better than it is not needed

## 2015-06-19 ENCOUNTER — Telehealth: Payer: Self-pay | Admitting: Obstetrics and Gynecology

## 2015-06-19 DIAGNOSIS — N920 Excessive and frequent menstruation with regular cycle: Secondary | ICD-10-CM

## 2015-06-19 MED ORDER — AMOXICILLIN 875 MG PO TABS: 875.0000 mg | ORAL_TABLET | Freq: Two times a day (BID) | ORAL | Status: DC

## 2015-06-19 NOTE — Telephone Encounter (Signed)
Routing to [place] for review of labs from 06/14/2015.

## 2015-06-19 NOTE — Telephone Encounter (Signed)
[place] in regards to results-eh

## 2015-06-19 NOTE — Telephone Encounter (Signed)
Spoke with pt, she is not better and would like the Rx sent in. Done.

## 2015-06-19 NOTE — Telephone Encounter (Signed)
    Patient calling for lab results 

## 2015-06-19 NOTE — Telephone Encounter (Signed)
-----   Message from [name], MD sent at 06/19/2015  3:56 PM EDT ----- Please inform the patient that she is still mildly anemic, check on her cycles, how long she is bleeding and how quickly she saturating a pad or tampon. Is she on iron? Her Ferritin is in the low normal range.  Her vit d level is now normal. She should change to taking 1,000 IU of vit d 3 a day. If her bleeding isn't excessive, she should f/u with a primary for evaluation of anemia.

## 2015-06-19 NOTE — Telephone Encounter (Signed)
Patient is returning a call to Elaine. °

## 2015-06-20 NOTE — Telephone Encounter (Signed)
Spoke with patient. Advised of message and results as seen below from [place]. She is agreeable and verbalizes understanding. States her cycles are usually 5-6 days in length. Reports they have been heavier than usual over the last few months. States she has to change her tampon every 2 hours due to filling the tampon. "I wear a tampon and a pad because sometimes I can not change it fast enough." Reports she is taking a daily iron supplement. She is unable to provide me the brand or the amount of iron that is in each tablet. Advised I will speak with Dr.Jertson regarding cycles and anemia and return call with further recommendations. She is agreeable.

## 2015-06-20 NOTE — Telephone Encounter (Signed)
Her bleeding could be the cause of her anemia. Please set her up for a GYN ultrasound and a f/u appointment with me after the ultrasound.

## 2015-06-20 NOTE — Telephone Encounter (Signed)
Spoke with patient. Patient is agreeable and verbalizes understanding. PUS scheduled for 06/26/2015 at 1 pm with 1:30 pm consult with Dr.Jertson. Order placed for PUS. Patient states she is taking Ferrous Sulfate 45 mg daily. Asking if she needs to increase this dose. Advised I will speak with Dr.Jertson and return call. She is agreeable.  Cc: Lerry Liner for precert

## 2015-06-20 NOTE — Telephone Encounter (Signed)
Left detailed message at number provided [phone], okay per ROI. Advised of message as seen below from [place]. Advised to return call to call [place] at [phone] with any further questions.  Routing to provider for final review. Patient agreeable to disposition. Will close encounter.

## 2015-06-20 NOTE — Telephone Encounter (Signed)
I would have her double her current iron dose. Take it 2 x a day

## 2015-06-26 ENCOUNTER — Ambulatory Visit (INDEPENDENT_AMBULATORY_CARE_PROVIDER_SITE_OTHER): Payer: BLUE CROSS/BLUE SHIELD

## 2015-06-26 ENCOUNTER — Encounter: Payer: Self-pay | Admitting: Obstetrics and Gynecology

## 2015-06-26 ENCOUNTER — Ambulatory Visit (INDEPENDENT_AMBULATORY_CARE_PROVIDER_SITE_OTHER): Payer: BLUE CROSS/BLUE SHIELD | Admitting: Obstetrics and Gynecology

## 2015-06-26 VITALS — BP 110/70 | HR 72 | Resp 16 | Wt 195.0 lb

## 2015-06-26 DIAGNOSIS — D500 Iron deficiency anemia secondary to blood loss (chronic): Secondary | ICD-10-CM | POA: Diagnosis not present

## 2015-06-26 DIAGNOSIS — N920 Excessive and frequent menstruation with regular cycle: Secondary | ICD-10-CM | POA: Diagnosis not present

## 2015-06-26 DIAGNOSIS — N946 Dysmenorrhea, unspecified: Secondary | ICD-10-CM

## 2015-06-26 MED ORDER — NORETHIN ACE-ETH ESTRAD-FE 1-20 MG-MCG PO TABS: 1.0000 | ORAL_TABLET | Freq: Every day | ORAL | Status: DC

## 2015-06-26 MED ORDER — NAPROXEN SODIUM 550 MG PO TABS
ORAL_TABLET | ORAL | Status: DC
Start: 2015-06-26 — End: 2015-12-21

## 2015-06-26 NOTE — Patient Instructions (Signed)

## 2015-06-26 NOTE — Progress Notes (Signed)
GYNECOLOGY  VISIT   HPI: 40 y.o.   Married  [place] American  female   [phone] with Patient's last menstrual period was 06/07/2015 (exact date).   here for pelvic U/S. The patient has been having heavy cycles and has mild anemia. She can go through a super tampon in up to 1 hour at times. Recent hgb of 11.4 on iron. Ferritin of 25, normal TSH. Cramps are bad for 1.5 days, only on her left side.   GYNECOLOGIC HISTORY: Patient's last menstrual period was 06/07/2015 (exact date). Contraception:Tubal Ligation  Menopausal hormone therapy: none         OB History    Gravida Para Term Preterm AB TAB SAB Ectopic Multiple Living   4 2 2  2  2   2          There are no active problems to display for this patient.   History reviewed. No pertinent past medical history.  Past Surgical History  Procedure Laterality Date  . Cesarean section    . Tubal ligation      Current Outpatient Prescriptions  Medication Sig Dispense Refill  . amoxicillin (AMOXIL) 875 MG tablet Take 1 tablet (875 mg total) by mouth 2 (two) times daily. 20 tablet 0  . ferrous sulfate 325 (65 FE) MG tablet Take 325 mg by mouth daily with breakfast.    . HYDROcodone-homatropine (HYCODAN) 5-1.5 MG/5ML syrup Take 2.5-5 mLs by mouth at bedtime as needed. 60 mL 0  . Vitamin D, Ergocalciferol, (DRISDOL) 50000 units CAPS capsule TAKE 1 CAPSULE BY MOUTH EVERY 7 DAYS (Patient not taking: Reported on 06/26/2015) 4 capsule 0   No current facility-administered medications for this visit.     ALLERGIES: Review of patient's allergies indicates no known allergies.  Family History  Problem Relation Age of Onset  . Hypertension Mother     Social History   Social History  . Marital Status: Married    Spouse Name: N/A  . Number of Children: N/A  . Years of Education: N/A   Occupational History  . Not on file.   Social History Main Topics  . Smoking status: Former Smoker -- 0.50 packs/day for 17 years  . Smokeless  tobacco: Former Systems developer    Quit date: 11/27/2012  . Alcohol Use: 0.0 oz/week    0 Standard drinks or equivalent per week     Comment: 1-2 per month  . Drug Use: No  . Sexual Activity:    Partners: Male   Other Topics Concern  . Not on file   Social History Narrative    Review of Systems  Constitutional: Negative.   HENT: Negative.   Eyes: Negative.   Respiratory: Negative.   Cardiovascular: Negative.   Gastrointestinal: Negative.   Genitourinary:       Irregular menstrual cycles   Musculoskeletal: Negative.   Skin: Negative.   Neurological: Negative.   Endo/Heme/Allergies: Negative.   Psychiatric/Behavioral: Negative.     PHYSICAL EXAMINATION:    BP 110/70 mmHg  Pulse 72  Resp 16  Wt 195 lb (88.451 kg)  LMP 06/07/2015 (Exact Date)    General appearance: alert, cooperative and appears stated age  ASSESSMENT Menorrhagia, mild anemia Normal ultrasound Dysmenorrhea    PLAN Discussed options of treatment, particularly OCP's and the mirena IUD. No contraindications to OCP's, risks reviewed Trial of OCP's F/U in 3 months Anaprox for cramps Continue iron for now F/U CBC at her f/u visit   An After Visit Summary was  printed and given to the patient.  15 minutes face to face time of which over 50% was spent in counseling.

## 2015-09-18 ENCOUNTER — Encounter: Payer: Self-pay | Admitting: Obstetrics and Gynecology

## 2015-09-18 ENCOUNTER — Ambulatory Visit (INDEPENDENT_AMBULATORY_CARE_PROVIDER_SITE_OTHER): Payer: BLUE CROSS/BLUE SHIELD | Admitting: Obstetrics and Gynecology

## 2015-09-18 ENCOUNTER — Telehealth: Payer: Self-pay | Admitting: Obstetrics and Gynecology

## 2015-09-18 VITALS — BP 118/79 | HR 72 | Resp 14 | Wt 187.0 lb

## 2015-09-18 DIAGNOSIS — M7918 Myalgia, other site: Secondary | ICD-10-CM

## 2015-09-18 DIAGNOSIS — R3915 Urgency of urination: Secondary | ICD-10-CM

## 2015-09-18 DIAGNOSIS — N3090 Cystitis, unspecified without hematuria: Secondary | ICD-10-CM

## 2015-09-18 DIAGNOSIS — Z862 Personal history of diseases of the blood and blood-forming organs and certain disorders involving the immune mechanism: Secondary | ICD-10-CM | POA: Diagnosis not present

## 2015-09-18 DIAGNOSIS — R350 Frequency of micturition: Secondary | ICD-10-CM | POA: Diagnosis not present

## 2015-09-18 DIAGNOSIS — M791 Myalgia: Secondary | ICD-10-CM

## 2015-09-18 DIAGNOSIS — N946 Dysmenorrhea, unspecified: Secondary | ICD-10-CM | POA: Diagnosis not present

## 2015-09-18 DIAGNOSIS — F411 Generalized anxiety disorder: Secondary | ICD-10-CM | POA: Diagnosis not present

## 2015-09-18 DIAGNOSIS — N920 Excessive and frequent menstruation with regular cycle: Secondary | ICD-10-CM

## 2015-09-18 LAB — POCT URINALYSIS DIPSTICK
Bilirubin, UA: NEGATIVE
GLUCOSE UA: NEGATIVE
Ketones, UA: NEGATIVE
Nitrite, UA: NEGATIVE
Protein, UA: NEGATIVE
UROBILINOGEN UA: NEGATIVE
pH, UA: 7.5

## 2015-09-18 LAB — CBC WITH DIFFERENTIAL/PLATELET
BASOS PCT: 0 %
Basophils Absolute: 0 cells/uL (ref 0–200)
EOS ABS: 58 {cells}/uL (ref 15–500)
Eosinophils Relative: 1 %
HEMATOCRIT: 37.3 % (ref 35.0–45.0)
Hemoglobin: 12.2 g/dL (ref 11.7–15.5)
Lymphocytes Relative: 38 %
Lymphs Abs: 2204 cells/uL (ref 850–3900)
MCH: 27.5 pg (ref 27.0–33.0)
MCHC: 32.7 g/dL (ref 32.0–36.0)
MCV: 84.2 fL (ref 80.0–100.0)
MONO ABS: 522 {cells}/uL (ref 200–950)
MPV: 9.8 fL (ref 7.5–12.5)
Monocytes Relative: 9 %
Neutro Abs: 3016 cells/uL (ref 1500–7800)
Neutrophils Relative %: 52 %
Platelets: 267 10*3/uL (ref 140–400)
RBC: 4.43 MIL/uL (ref 3.80–5.10)
RDW: 13.7 % (ref 11.0–15.0)
WBC: 5.8 10*3/uL (ref 3.8–10.8)

## 2015-09-18 MED ORDER — CITALOPRAM HYDROBROMIDE 20 MG PO TABS: ORAL_TABLET | ORAL | 1 refills | Status: DC

## 2015-09-18 MED ORDER — PHENAZOPYRIDINE HCL 200 MG PO TABS: ORAL_TABLET | ORAL | 0 refills | Status: DC

## 2015-09-18 MED ORDER — SULFAMETHOXAZOLE-TRIMETHOPRIM 800-160 MG PO TABS: 1.0000 | ORAL_TABLET | Freq: Two times a day (BID) | ORAL | 0 refills | Status: DC

## 2015-09-18 MED ORDER — CYCLOBENZAPRINE HCL 5 MG PO TABS: 5.0000 mg | ORAL_TABLET | Freq: Three times a day (TID) | ORAL | 0 refills | Status: DC | PRN

## 2015-09-18 MED ORDER — NORETHIN ACE-ETH ESTRAD-FE 1-20 MG-MCG PO TABS: 1.0000 | ORAL_TABLET | Freq: Every day | ORAL | 1 refills | Status: DC

## 2015-09-18 NOTE — Telephone Encounter (Signed)
Call to patient, left message to call back. Ask for triage nurse. Last office visit 06-26-15 and was started on OCP's for 3 month trial. Due for follow-up.

## 2015-09-18 NOTE — Telephone Encounter (Signed)
Patient called for two reasons:  1. She reports having sharp pains on her left side. She said, "It's so bad I am doubled over."  2. Patient requested birth control refills be sent to the pharmacy on file.  Last see 06/26/15.

## 2015-09-18 NOTE — Telephone Encounter (Signed)
Spoke with patient. Patient reports for the last 2-3 days she has been experiencing left lower quadrant pain. Reports pain has been gradually increasing and is worse today. Patient is experiencing lower back pain and urinary frequency. Denies fever, chills, nausea, or vomiting. Reports having normal regular bowel movements. LMP was 08/20/2015-08/23/2015. Is currently on her last week of her Loestrin Fe pack. Denies any vaginal bleeding since last menses. Patient has had a tubal ligation. Advised she will need to be seen in the office for further evaluation. She is agreeable. Appointment scheduled for today at 11:30 am with Dr.Jertson. She is agreeable to date and time.  Routing to provider for final review. Patient agreeable to disposition. Will close encounter.

## 2015-09-18 NOTE — Progress Notes (Signed)
GYNECOLOGY  VISIT   HPI: 40 y.o.   Legally Separated  African American  female   [phone] with Patient's last menstrual period was 08/20/2015.   here c/o LLQ pain with urinary frequency and urgency X 3 days.  The pain is on her left side to left back, it is an intermittent sharp pain, up to a 9/10 in severity. She also c/o an increase of urinary frequency and urgency, over the last week she had some urge incontinence (new), small amounts. No dysuria, no fever or chills.  The patient was seen in May with menorrhagia dysmenorrhea, and mild anemia. She had a normal ultrasound at that visit and was started on OCP's.  She is on her 3rd pack. Cycles q month x 4 days. She is saturating a regular tampon in 6 hours. Cramping is much better. No side effects. She has lost weight.  She is under a lot of stress at work. Feeling anxiety. 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 She has a lot on her plate.   GYNECOLOGIC HISTORY: Patient's last menstrual period was 08/20/2015. Contraception:OCp Menopausal hormone therapy: none         OB History    Gravida Para Term Preterm AB Living   4 2 2   2 2    SAB TAB Ectopic Multiple Live Births   2       2         There are no active problems to display for this patient.   No past medical history on file.  Past Surgical History:  Procedure Laterality Date  . CESAREAN SECTION    . TUBAL LIGATION      Current Outpatient Prescriptions  Medication Sig Dispense Refill  . ferrous sulfate 325 (65 FE) MG tablet Take 325 mg by mouth daily with breakfast.    . naproxen sodium (ANAPROX DS) 550 MG tablet 1 tab po q 12 hours prn 30 tablet 2  . norethindrone-ethinyl estradiol (JUNEL FE,GILDESS FE,LOESTRIN FE) 1-20 MG-MCG tablet Take 1 tablet by mouth daily. 3 Package 0   No current facility-administered medications for this visit.      ALLERGIES: Review of patient's allergies indicates no known  allergies.  Family History  Problem Relation Age of Onset  . Hypertension Mother     Social History   Social History  . Marital status: Legally Separated    Spouse name: N/A  . Number of children: N/A  . Years of education: N/A   Occupational History  . Not on file.   Social History Main Topics  . Smoking status: Former Smoker    Packs/day: 0.50    Years: 17.00  . Smokeless tobacco: Former Systems developer    Quit date: 11/27/2012  . Alcohol use 0.0 oz/week     Comment: 1-2 per month  . Drug use: No  . Sexual activity: Yes    Partners: Male   Other Topics Concern  . Not on file   Social History Narrative  . No narrative on file    Review of Systems  Constitutional: Negative.   HENT: Negative.   Eyes: Negative.   Respiratory: Negative.   Cardiovascular: Negative.   Gastrointestinal: Negative.   Genitourinary: Positive for frequency and urgency.       LLQ pain  Musculoskeletal: Negative.   Skin: Negative.   Neurological: Negative.   Endo/Heme/Allergies: Negative.   Psychiatric/Behavioral: Negative.     PHYSICAL EXAMINATION:  BP 118/79 (BP Location: Right Arm, Patient Position: Sitting, Cuff Size: Normal)   Pulse 72   Resp 14   Wt 187 lb (84.8 kg)   LMP 08/20/2015   BMI 32.61 kg/m     General appearance: alert, cooperative and appears stated age Abdomen: soft, non-tender; bowel sounds normal; no masses,  no organomegaly No CVA tenderness Tender with palpation of her left side, worse when tensing her muscles    ASSESSMENT Left side pain, suspect MS pain Urinary frequency and urgency, urine dip with blood and wbc, concerning for UTI Anxiety H/O menorrhagia and anemia. Menorrhagia improved on OCP's Dysmenorrhea improved on OCP's    PLAN CBC with diff, Ferritin Anaprox for pain (she has it) Flexeril prn Will treat for a presumed UTI Start Celexa, f/u in 1 month Continue OCP's Due for an annual in 2/18   An After Visit Summary was printed and given  to the patient.  25 minutes face to face time of which over 50% was spent in counseling.

## 2015-09-19 ENCOUNTER — Telehealth: Payer: Self-pay | Admitting: *Deleted

## 2015-09-19 LAB — URINALYSIS, MICROSCOPIC ONLY
Casts: NONE SEEN [LPF]
Crystals: NONE SEEN [HPF]
Yeast: NONE SEEN [HPF]

## 2015-09-19 LAB — FERRITIN: Ferritin: 50 ng/mL (ref 10–154)

## 2015-09-19 NOTE — Telephone Encounter (Signed)
Left message to call regarding results -eh 

## 2015-09-19 NOTE — Telephone Encounter (Signed)
Patient called back and I gave her results -eh

## 2015-09-19 NOTE — Telephone Encounter (Signed)
-----   Message from [name], MD sent at 09/19/2015  8:13 AM EDT ----- Please inform the patient she is no longer anemic. Urine culture is pending.

## 2015-09-20 ENCOUNTER — Telehealth: Payer: Self-pay | Admitting: *Deleted

## 2015-09-20 LAB — URINE CULTURE

## 2015-09-20 NOTE — Telephone Encounter (Signed)
-----   Message from [name], MD sent at 09/20/2015 10:44 AM EDT ----- Please inform the patient that her urine culture was negative for infection. She should stop her antibiotics. She should let me know if her urinary symptoms persist.

## 2015-09-20 NOTE — Telephone Encounter (Signed)
Left message to call regarding results -eh 

## 2015-09-20 NOTE — Telephone Encounter (Signed)
Spoke with patient. Results given. Patient is agreeable and verbalizes understanding.  Routing to provider for final review. Patient agreeable to disposition. Will close encounter.     

## 2015-09-26 ENCOUNTER — Ambulatory Visit: Payer: BLUE CROSS/BLUE SHIELD | Admitting: Obstetrics and Gynecology

## 2015-10-16 ENCOUNTER — Ambulatory Visit: Payer: BLUE CROSS/BLUE SHIELD | Admitting: Obstetrics and Gynecology

## 2015-10-18 ENCOUNTER — Ambulatory Visit (INDEPENDENT_AMBULATORY_CARE_PROVIDER_SITE_OTHER): Payer: BLUE CROSS/BLUE SHIELD | Admitting: Obstetrics and Gynecology

## 2015-10-18 ENCOUNTER — Encounter: Payer: Self-pay | Admitting: Obstetrics and Gynecology

## 2015-10-18 VITALS — BP 122/78 | HR 60 | Resp 13 | Wt 181.0 lb

## 2015-10-18 DIAGNOSIS — F419 Anxiety disorder, unspecified: Secondary | ICD-10-CM

## 2015-10-18 MED ORDER — CITALOPRAM HYDROBROMIDE 20 MG PO TABS: ORAL_TABLET | ORAL | 1 refills | Status: DC

## 2015-10-18 NOTE — Progress Notes (Signed)
GYNECOLOGY  VISIT   HPI: 40 y.o.   Legally Separated  African American  female   [phone] with Patient's last menstrual period was 10/05/2015 (exact date).   here to follow up on anxiety. Last month the patient was started on Celexa for anxiety. She is doing great! Feels so much better.   GYNECOLOGIC HISTORY: Patient's last menstrual period was 10/05/2015 (exact date). Contraception:OCp Menopausal hormone therapy: none        OB History    Gravida Para Term Preterm AB Living   4 2 2   2 2    SAB TAB Ectopic Multiple Live Births   2       2         There are no active problems to display for this patient.   No past medical history on file.  Past Surgical History:  Procedure Laterality Date  . CESAREAN SECTION    . TUBAL LIGATION      Current Outpatient Prescriptions  Medication Sig Dispense Refill  . citalopram (CELEXA) 20 MG tablet 1/2 tab po qd x 1 week, then 1 tab po qd 30 tablet 1  . cyclobenzaprine (FLEXERIL) 5 MG tablet Take 1 tablet (5 mg total) by mouth 3 (three) times daily as needed for muscle spasms. 20 tablet 0  . naproxen sodium (ANAPROX DS) 550 MG tablet 1 tab po q 12 hours prn 30 tablet 2  . norethindrone-ethinyl estradiol (JUNEL FE,GILDESS FE,LOESTRIN FE) 1-20 MG-MCG tablet Take 1 tablet by mouth daily. 3 Package 1   No current facility-administered medications for this visit.      ALLERGIES: Review of patient's allergies indicates no known allergies.  Family History  Problem Relation Age of Onset  . Hypertension Mother     Social History   Social History  . Marital status: Legally Separated    Spouse name: N/A  . Number of children: N/A  . Years of education: N/A   Occupational History  . Not on file.   Social History Main Topics  . Smoking status: Former Smoker    Packs/day: 0.50    Years: 17.00  . Smokeless tobacco: Former Systems developer    Quit date: 11/27/2012  . Alcohol use 0.0 oz/week     Comment: 1-2 per month  . Drug use: No  . Sexual  activity: Yes    Partners: Male   Other Topics Concern  . Not on file   Social History Narrative  . No narrative on file    Review of Systems  Constitutional: Negative.   HENT: Negative.   Eyes: Negative.   Respiratory: Negative.   Cardiovascular: Negative.   Gastrointestinal: Negative.   Genitourinary: Negative.   Musculoskeletal: Negative.   Skin: Negative.   Neurological: Negative.   Endo/Heme/Allergies: Negative.   Psychiatric/Behavioral: Negative.     PHYSICAL EXAMINATION:    BP 122/78 (BP Location: Right Arm, Patient Position: Sitting, Cuff Size: Normal)   Pulse 60   Resp 13   Wt 181 lb (82.1 kg)   LMP 10/05/2015 (Exact Date)   BMI 31.56 kg/m     General appearance: alert, cooperative and appears stated age  ASSESSMENT Anxiety, improved with Celexa    PLAN F/U for her annual exam in 2/18 Mammogram information given (she just turned 40)   An After Visit Summary was printed and given to the patient.

## 2015-12-21 ENCOUNTER — Emergency Department (HOSPITAL_COMMUNITY)
Admission: EM | Admit: 2015-12-21 | Discharge: 2015-12-22 | Disposition: A | Discharge status: Home / Self Care | Payer: Self-pay | Attending: Emergency Medicine | Admitting: Emergency Medicine

## 2015-12-21 ENCOUNTER — Encounter (HOSPITAL_COMMUNITY): Payer: Self-pay | Admitting: Emergency Medicine

## 2015-12-21 ENCOUNTER — Emergency Department (HOSPITAL_COMMUNITY): Payer: Self-pay

## 2015-12-21 DIAGNOSIS — R05 Cough: Secondary | ICD-10-CM

## 2015-12-21 DIAGNOSIS — R059 Cough, unspecified: Secondary | ICD-10-CM

## 2015-12-21 DIAGNOSIS — Z87891 Personal history of nicotine dependence: Secondary | ICD-10-CM | POA: Insufficient documentation

## 2015-12-21 DIAGNOSIS — J9801 Acute bronchospasm: Secondary | ICD-10-CM | POA: Insufficient documentation

## 2015-12-21 DIAGNOSIS — Z79899 Other long term (current) drug therapy: Secondary | ICD-10-CM | POA: Insufficient documentation

## 2015-12-21 HISTORY — DX: Anxiety disorder, unspecified: F41.9

## 2015-12-21 MED ORDER — IPRATROPIUM BROMIDE 0.02 % IN SOLN
0.5000 mg | Freq: Once | RESPIRATORY_TRACT | Status: AC
  Administered 2015-12-22: 0.5 mg via RESPIRATORY_TRACT
  Filled 2015-12-21: qty 2.5

## 2015-12-21 MED ORDER — ALBUTEROL SULFATE (2.5 MG/3ML) 0.083% IN NEBU
5.0000 mg | INHALATION_SOLUTION | Freq: Once | RESPIRATORY_TRACT | Status: AC
  Administered 2015-12-22: 5 mg via RESPIRATORY_TRACT
  Filled 2015-12-21: qty 6

## 2015-12-21 NOTE — ED Notes (Signed)
Bed: WA05 Expected date:  Expected time:  Means of arrival:  Comments: 

## 2015-12-21 NOTE — ED Provider Notes (Signed)
[place] DEPT Provider Note   CSN: [id number] Arrival date & time: 12/21/15  2125  By signing my name below, I, Hansel Feinstein, attest that this documentation has been prepared under the direction and in the presence of Charlann Lange, PA-C. Electronically Signed: Hansel Feinstein, ED Scribe. 12/21/15. 11:29 PM.    History   Chief Complaint Chief Complaint  Patient presents with  . Shortness of Breath    HPI Barbara Thornton is a 40 y.o. female who presents to the Emergency Department complaining of gradually worsening SOB x 2 days and worsened today with associated dry cough. Pt states she does not feel like she is getting in enough air. Pt states her SOB is worsened when lying flat, with talking and alleviated when sitting up. Pt denies taking OTC medications at home to improve symptoms. No h/o asthma or similar symptoms. No recent long travel, h/o PE/DVT. She is a former smoker, quit 4 years ago. Pt states she is very active at baseline, running 2 miles per day. 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 She denies leg pain, CP, vomiting, fever, rhinorrhea, sore throat.   The history is provided by the patient. No language interpreter was used.    Past Medical History:  Diagnosis Date  . Anxiety     There are no active problems to display for this patient.   Past Surgical History:  Procedure Laterality Date  . CESAREAN SECTION    . TUBAL LIGATION      OB History    Gravida Para Term Preterm AB Living   4 2 2   2 2    SAB TAB Ectopic Multiple Live Births   2       2       Home Medications    Prior to Admission medications   Medication Sig Start Date End Date Taking? Authorizing Provider  BIOTIN PO Take 2 tablets by mouth daily.   Yes Historical Provider, MD  citalopram (CELEXA) 20 MG tablet 1/2 tab po qd x 1 week, then 1 tab po qd Patient taking differently: Take 20 mg by mouth daily.  10/18/15  Yes [name], MD  norethindrone-ethinyl estradiol (JUNEL FE,GILDESS  FE,LOESTRIN FE) 1-20 MG-MCG tablet Take 1 tablet by mouth daily. 09/18/15  Yes [name], MD  cyclobenzaprine (FLEXERIL) 5 MG tablet Take 1 tablet (5 mg total) by mouth 3 (three) times daily as needed for muscle spasms. Patient not taking: Reported on 12/21/2015 09/18/15   [name], MD    Family History Family History  Problem Relation Age of Onset  . Hypertension Mother     Social History Social History  Substance Use Topics  . Smoking status: Former Smoker    Packs/day: 0.50    Years: 17.00  . Smokeless tobacco: Former Systems developer    Quit date: 11/27/2012  . Alcohol use 0.0 oz/week     Comment: 1-2 per month     Allergies   Patient has no known allergies.   Review of Systems Review of Systems  Constitutional: Negative for fever.  HENT: Negative for rhinorrhea and sore throat.   Respiratory: Positive for cough and shortness of breath.   Cardiovascular: Negative for chest pain.  Gastrointestinal: Negative for vomiting.  Musculoskeletal: Negative for myalgias.  Skin: Negative for rash.  Neurological: Negative.      Physical Exam Updated Vital Signs BP (!) 164/101 (BP Location: Left Arm)   Pulse 88   Temp 98.2 F (36.8 C) (Oral)  Resp 22   Ht 5\' 2"  (1.575 m)   Wt 180 lb (81.6 kg)   LMP 12/12/2015 (Exact Date)   SpO2 100%   BMI 32.92 kg/m   Physical Exam  Constitutional: She appears well-developed and well-nourished.  HENT:  Head: Normocephalic.  Eyes: Conjunctivae are normal.  Cardiovascular: Normal rate and regular rhythm.   RRR no tachycardia  Pulmonary/Chest: Effort normal and breath sounds normal. No respiratory distress. She has no wheezes. She has no rales.  Lungs CTA bilaterally. No wheezing rales or rhonchi. There is guarding against deep breath and active dry cough during exam.   Abdominal: She exhibits no distension.  Musculoskeletal: Normal range of motion.  Neurological: She is alert.  Skin: Skin is warm and dry.  Psychiatric:  She has a normal mood and affect. Her behavior is normal.  Nursing note and vitals reviewed.    ED Treatments / Results   DIAGNOSTIC STUDIES: Oxygen Saturation is 100% on RA, normal by my interpretation.    COORDINATION OF CARE: 11:26 PM Discussed treatment plan with pt at bedside which includes CXR and pt agreed to plan.    Labs (all labs ordered are listed, but only abnormal results are displayed) Labs Reviewed - No data to display  EKG  EKG Interpretation None       Radiology Dg Chest 2 View  Result Date: 12/21/2015 CLINICAL DATA:  Shortness of breath. EXAM: CHEST  2 VIEW COMPARISON:  None. FINDINGS: The heart size and mediastinal contours are within normal limits. Both lungs are clear. No pneumothorax or pleural effusion is noted. The visualized skeletal structures are unremarkable. IMPRESSION: No active cardiopulmonary disease. Electronically Signed   By: Marijo Conception, M.D.   On: 12/21/2015 21:59    Procedures Procedures (including critical care time)  Medications Ordered in ED Medications - No data to display   Initial Impression / Assessment and Plan / ED Course  I have reviewed the triage vital signs and the nursing notes.  Pertinent labs & imaging results that were available during my care of the patient were reviewed by me and considered in my medical decision making (see chart for details).  Clinical Course     Patient with complaint of cough, worse with activity and lying down. No fever. She was given 2 nebulizer treatments and feels she is coughing less. She is well appearing and felt stable for discharge with negative CXR.   Final Clinical Impressions(s) / ED Diagnoses   Final diagnoses:  None   1. Bronchospasm 2. Cough   New Prescriptions New Prescriptions   No medications on file    I personally performed the services described in this documentation, which was scribed in my presence. The recorded information has been reviewed and is  accurate.      Charlann Lange, PA-C [phone]    Delora Fuel, MD [phone]

## 2015-12-21 NOTE — ED Triage Notes (Signed)
Patient complaining of Shortness of breathe and tightness of chest with not pain. Patient states this started two days ago. Patient does not know where this is coming from. She states she is in the best shape now than she has ever been.

## 2015-12-22 MED ORDER — HYDROCODONE-ACETAMINOPHEN 7.5-325 MG/15ML PO SOLN: 10.0000 mL | Freq: Every day | ORAL | 0 refills | Status: DC

## 2015-12-22 MED ORDER — ALBUTEROL SULFATE HFA 108 (90 BASE) MCG/ACT IN AERS
2.0000 | INHALATION_SPRAY | RESPIRATORY_TRACT | Status: DC | PRN
  Administered 2015-12-22: 2 via RESPIRATORY_TRACT
  Filled 2015-12-22: qty 6.7

## 2015-12-22 MED ORDER — IPRATROPIUM BROMIDE 0.02 % IN SOLN
0.5000 mg | Freq: Once | RESPIRATORY_TRACT | Status: AC
  Administered 2015-12-22: 0.5 mg via RESPIRATORY_TRACT
  Filled 2015-12-22: qty 2.5

## 2015-12-22 MED ORDER — ALBUTEROL SULFATE (2.5 MG/3ML) 0.083% IN NEBU
5.0000 mg | INHALATION_SOLUTION | Freq: Once | RESPIRATORY_TRACT | Status: AC
  Administered 2015-12-22: 5 mg via RESPIRATORY_TRACT
  Filled 2015-12-22: qty 6

## 2016-01-06 ENCOUNTER — Emergency Department (HOSPITAL_BASED_OUTPATIENT_CLINIC_OR_DEPARTMENT_OTHER)
Admission: EM | Admit: 2016-01-06 | Discharge: 2016-01-06 | Disposition: A | Discharge status: Home / Self Care | Payer: BLUE CROSS/BLUE SHIELD | Source: Home / Self Care

## 2016-01-06 ENCOUNTER — Encounter (HOSPITAL_COMMUNITY): Payer: Self-pay | Admitting: Emergency Medicine

## 2016-01-06 ENCOUNTER — Emergency Department (HOSPITAL_COMMUNITY)
Admission: EM | Admit: 2016-01-06 | Discharge: 2016-01-06 | Disposition: A | Discharge status: Home / Self Care | Payer: BLUE CROSS/BLUE SHIELD | Attending: Emergency Medicine | Admitting: Emergency Medicine

## 2016-01-06 DIAGNOSIS — Z7901 Long term (current) use of anticoagulants: Secondary | ICD-10-CM | POA: Diagnosis not present

## 2016-01-06 DIAGNOSIS — I82401 Acute embolism and thrombosis of unspecified deep veins of right lower extremity: Secondary | ICD-10-CM

## 2016-01-06 DIAGNOSIS — I82811 Embolism and thrombosis of superficial veins of right lower extremities: Secondary | ICD-10-CM | POA: Diagnosis not present

## 2016-01-06 DIAGNOSIS — M79609 Pain in unspecified limb: Secondary | ICD-10-CM | POA: Diagnosis not present

## 2016-01-06 DIAGNOSIS — M7989 Other specified soft tissue disorders: Secondary | ICD-10-CM | POA: Diagnosis not present

## 2016-01-06 DIAGNOSIS — Z87891 Personal history of nicotine dependence: Secondary | ICD-10-CM | POA: Insufficient documentation

## 2016-01-06 LAB — I-STAT CHEM 8, ED
BUN: 20 mg/dL (ref 6–20)
CALCIUM ION: 1.16 mmol/L (ref 1.15–1.40)
CHLORIDE: 102 mmol/L (ref 101–111)
Creatinine, Ser: 0.9 mg/dL (ref 0.44–1.00)
GLUCOSE: 109 mg/dL — AB (ref 65–99)
HCT: 43 % (ref 36.0–46.0)
Hemoglobin: 14.6 g/dL (ref 12.0–15.0)
Potassium: 4.3 mmol/L (ref 3.5–5.1)
Sodium: 141 mmol/L (ref 135–145)
TCO2: 27 mmol/L (ref 0–100)

## 2016-01-06 MED ORDER — RIVAROXABAN 15 MG PO TABS
15.0000 mg | ORAL_TABLET | Freq: Once | ORAL | Status: AC
  Administered 2016-01-06: 15 mg via ORAL
  Filled 2016-01-06: qty 1

## 2016-01-06 MED ORDER — RIVAROXABAN (XARELTO) EDUCATION KIT FOR DVT/PE PATIENTS
PACK | Freq: Once | Status: DC
  Filled 2016-01-06: qty 1

## 2016-01-06 MED ORDER — RIVAROXABAN (XARELTO) VTE STARTER PACK (15 & 20 MG): ORAL_TABLET | ORAL | 0 refills | Status: DC

## 2016-01-06 NOTE — Progress Notes (Signed)
   ANTICOAGULATION CONSULT NOTE - Initial Consult  Pharmacy Consult for Xarelto Indication: VTE treatment  No Known Allergies  Patient Measurements: Height: 5\' 2"  (157.5 cm) Weight: 180 lb (81.6 kg) IBW/kg (Calculated) : 50.1   Vital Signs: Temp: 98 F (36.7 C) (12/10 1533) Temp Source: Oral (12/10 1533) BP: 136/94 (12/10 1909) Pulse Rate: 87 (12/10 1909)  Labs:  Recent Labs  01/06/16 1711  HGB 14.6  HCT 43.0  CREATININE 0.90    Estimated Creatinine Clearance: 82.2 mL/min (by C-G formula based on SCr of 0.9 mg/dL).   Medical History: Past Medical History:  Diagnosis Date  . Anxiety     Medications:  Scheduled:  . rivaroxaban   Does not apply Once  . rivaroxaban  15 mg Oral Once   Infusions:    Assessment: 89 yoF c/o right groin pain and right leg swelling. Denies CP or SOB.  Xarelto for VTE treatment. Goal of Therapy:  Treat VTE  Plan:  Xarelto 15 mg bidwc x 21 days then Xarelto 20mg  daily Provide xarelto education in ED  Lawana Pai R 01/06/2016,7:27 PM

## 2016-01-06 NOTE — Discharge Instructions (Signed)
Please discontinue birth control pills. Please start Xarelto and take as prescribed. Please follow-up with [facility] community health and wellness tomorrow.  Get help right away if: You have new or increased pain, swelling, or redness in an arm or leg. You have numbness or tingling in an arm or leg. You have shortness of breath while active or at rest. You have chest pain. You have a rapid or irregular heartbeat. You feel light-headed or dizzy. You cough up blood. You notice blood in your vomit, bowel movement, or urine.

## 2016-01-06 NOTE — ED Provider Notes (Signed)
[place] DEPT Provider Note   CSN: [id number] Arrival date & time: 01/06/16  1529     History   Chief Complaint Chief Complaint  Patient presents with  . Leg Swelling    HPI Barbara Thornton is a 40 y.o. female with no significant past medical history presents with right unilateral leg swelling since yesterday. She reports coughing hard yesterday and then noticing the symptoms of her entire right leg. She also reports pain in her right leg, pressure and 6/10. She denies any trauma, injury, or abrasions. She admits to taking birth control. She denies fevers, chills, urinary symptoms, changes in bowel movements, chest pain, shortness of breath, or history of DVT or PE.   HPI  Past Medical History:  Diagnosis Date  . Anxiety     There are no active problems to display for this patient.   Past Surgical History:  Procedure Laterality Date  . CESAREAN SECTION    . TUBAL LIGATION      OB History    Gravida Para Term Preterm AB Living   4 2 2   2 2    SAB TAB Ectopic Multiple Live Births   2       2       Home Medications    Prior to Admission medications   Medication Sig Start Date End Date Taking? Authorizing Provider  Biotin 1000 MCG tablet Take 1,000 mcg by mouth daily.   Yes Historical Provider, MD  citalopram (CELEXA) 20 MG tablet Take 20 mg by mouth daily.   Yes Historical Provider, MD  cyclobenzaprine (FLEXERIL) 5 MG tablet Take 1 tablet (5 mg total) by mouth 3 (three) times daily as needed for muscle spasms. 09/18/15  Yes [name], MD  Rivaroxaban 15 & 20 MG TBPK Take as directed on package: Start with one 31m tablet by mouth twice a day with food. On Day 22, switch to one 230mtablet once a day with food. 01/06/16   Mikah Rottinghaus [place]  Family History Family History  Problem Relation Age of Onset  . Hypertension Mother     Social History Social History  Substance Use Topics  . Smoking status: Former Smoker    Packs/day: 0.50      Years: 17.00  . Smokeless tobacco: Former Us[employer]s developer  Quit date: 11/27/2012  . Alcohol use 0.0 oz/week     Comment: 1-2 per month     Allergies   Patient has no known allergies.   Review of Systems Review of Systems  Constitutional: Negative for chills and fever.  Respiratory: Positive for cough. Negative for shortness of breath.   Cardiovascular: Positive for leg swelling (Right leg). Negative for chest pain and palpitations.  Gastrointestinal: Negative for abdominal pain, constipation, diarrhea, nausea and vomiting.  Genitourinary: Negative for difficulty urinating and dysuria.  Musculoskeletal: Positive for gait problem and myalgias (Right leg). Negative for back pain and neck pain.  Skin: Positive for color change (Right leg). Negative for rash and wound.  Neurological: Positive for numbness (Slight numbness to right leg).  Hematological: Does not bruise/bleed easily.  All other systems reviewed and are negative.    Physical Exam Updated Vital Signs BP 142/95 (BP Location: Left Arm)   Pulse 82   Temp 98 F (36.7 C) (Oral)   Resp 16   Ht 5' 2"  (1.575 m)   Wt 81.6 kg   LMP 12/12/2015 (Exact Date)   SpO2 98%   BMI 32.92 kg/m  Physical Exam  Constitutional: She is oriented to person, place, and time. She appears well-developed and well-nourished.  HENT:  Head: Normocephalic and atraumatic.  Mouth/Throat: Oropharynx is clear and moist.  Eyes: Conjunctivae and EOM are normal. Pupils are equal, round, and reactive to light.  Neck: Normal range of motion. Neck supple.  Cardiovascular: Normal rate and normal heart sounds.   Pulmonary/Chest: Effort normal and breath sounds normal. No respiratory distress.  Abdominal: Soft.  Musculoskeletal: She exhibits edema (Right leg. Sensation normal. ).  Positive Homans sign.  Neurological: She is alert and oriented to person, place, and time. No sensory deficit.  Skin: Skin is warm and intact. No bruising, no laceration, no  lesion and no rash noted. There is erythema (Right leg).  Psychiatric: She has a normal mood and affect. Her behavior is normal.  Nursing note and vitals reviewed.    ED Treatments / Results  Labs (all labs ordered are listed, but only abnormal results are displayed) Labs Reviewed  I-STAT CHEM 8, ED - Abnormal; Notable for the following:       Result Value   Glucose, Bld 109 (*)    All other components within normal limits    EKG  EKG Interpretation None       Radiology No results found.  Procedures Procedures (including critical care time)  Medications Ordered in ED Medications  rivaroxaban (XARELTO) Education Kit for DVT/PE patients (not administered)  Rivaroxaban (XARELTO) tablet 15 mg (15 mg Oral Given 01/06/16 1952)     Initial Impression / Assessment and Plan / ED Course  I have reviewed the triage vital signs and the nursing notes.  Pertinent labs & imaging results that were available during my care of the patient were reviewed by me and considered in my medical decision making (see chart for details).  Clinical Course   Patient is a 39 year old female presenting to the ED for unilateral right leg swelling since yesterday. On exam NAD, VSS, afebrile. Heart and lung sounds are clear. Right leg is significantly more swollen than the left and extends all the way up to her groin. Patient denies history of DVT or PE in the past. Denies chest pain shortness of breath. Denies fevers and chills. Patient admits to taking birth control pills.  Doppler ultrasound of right leg shows acute DVT involving common iliac, external iliac, saphenofemoral junction, common femoral, femoral, popliteal, posterior tibial, and peroneal veins of the right lower extremity. There is also evidence of superficial vein thrombosis involving the great saphenous and lesser saphenous veins the right lower extremity. The distal inferior vena cava was visualized and appeared to be patent. Left leg  unremarkable. Patient given Xarelto starter kit with education provided by pharmacist. Patient encouraged to discontinue birth control pills. Patient encouraged to follow up with [facility] community health and wellness tomorrow. Patient understood and agreed with assessment. Patient NAD, VSS, afebrile. Return precautions given for any new or worsening symptoms such as chest pain, shortness of breath, fevers, chills.  Final Clinical Impressions(s) / ED Diagnoses   Final diagnoses:  Acute deep vein thrombosis (DVT) of right lower extremity, unspecified vein (HCC)  Acute superficial venous thrombosis of right lower extremity    New Prescriptions Discharge Medication List as of 01/06/2016  8:51 PM    START taking these medications   Details  Rivaroxaban 15 & 20 MG TBPK Take as directed on package: Start with one 66m tablet by mouth twice a day with food. On Day 22, switch to one  2m tablet once a day with food., [place]12/10/17 2154    SSherwood Gambler MD 01/08/16 1239

## 2016-01-06 NOTE — ED Triage Notes (Signed)
Pt reports she coughed hard yesterday and began to have R groin pain. Since then she has had R leg swelling. Entire R leg noticeable swollen. No known injury or abrasions. No CP or SOB.

## 2016-01-06 NOTE — Progress Notes (Signed)
**  Preliminary report by tech**  Bilateral lower extremity venous duplex complete. There is evidence of acute deep vein thrombosis involving the common iliac, external iliac, saphenofemoral junction, common femoral, femoral, popliteal, posterior tibial, and peroneal veins of the right lower extremity. There is also evidence of superficial vein thrombosis involving the great saphenous, and lesser saphenous veins of the right lower extremity. There is no evidence of deep or superficial vein thrombosis involving the left lower extremity.  There is no evidence of Baker's cysts bilaterally. Due to the location of the deep vein thrombosis in the right lower extremity, the distal inferior vena cava was visualized and appeared to be patent.  Results were given to [facility].  01/06/16 8:08 PM Carlos Levering RVT

## 2016-01-07 ENCOUNTER — Encounter: Payer: Self-pay | Admitting: Family Medicine

## 2016-01-07 ENCOUNTER — Ambulatory Visit: Payer: BLUE CROSS/BLUE SHIELD | Attending: Family Medicine | Admitting: Family Medicine

## 2016-01-07 ENCOUNTER — Encounter: Payer: Self-pay | Admitting: Obstetrics and Gynecology

## 2016-01-07 ENCOUNTER — Ambulatory Visit (INDEPENDENT_AMBULATORY_CARE_PROVIDER_SITE_OTHER): Payer: BLUE CROSS/BLUE SHIELD | Admitting: Obstetrics and Gynecology

## 2016-01-07 ENCOUNTER — Telehealth: Payer: Self-pay | Admitting: Obstetrics and Gynecology

## 2016-01-07 VITALS — BP 140/88 | HR 84 | Resp 16 | Wt 187.0 lb

## 2016-01-07 VITALS — BP 137/86 | HR 87 | Temp 97.9°F | Ht 62.0 in | Wt 187.4 lb

## 2016-01-07 DIAGNOSIS — I82401 Acute embolism and thrombosis of unspecified deep veins of right lower extremity: Secondary | ICD-10-CM | POA: Insufficient documentation

## 2016-01-07 DIAGNOSIS — Z79899 Other long term (current) drug therapy: Secondary | ICD-10-CM | POA: Diagnosis not present

## 2016-01-07 DIAGNOSIS — N920 Excessive and frequent menstruation with regular cycle: Secondary | ICD-10-CM

## 2016-01-07 DIAGNOSIS — Z793 Long term (current) use of hormonal contraceptives: Secondary | ICD-10-CM | POA: Diagnosis not present

## 2016-01-07 DIAGNOSIS — I82411 Acute embolism and thrombosis of right femoral vein: Secondary | ICD-10-CM | POA: Diagnosis not present

## 2016-01-07 DIAGNOSIS — Z86718 Personal history of other venous thrombosis and embolism: Secondary | ICD-10-CM | POA: Diagnosis not present

## 2016-01-07 DIAGNOSIS — Z862 Personal history of diseases of the blood and blood-forming organs and certain disorders involving the immune mechanism: Secondary | ICD-10-CM

## 2016-01-07 DIAGNOSIS — I82409 Acute embolism and thrombosis of unspecified deep veins of unspecified lower extremity: Secondary | ICD-10-CM

## 2016-01-07 DIAGNOSIS — F419 Anxiety disorder, unspecified: Secondary | ICD-10-CM | POA: Insufficient documentation

## 2016-01-07 HISTORY — DX: Acute embolism and thrombosis of unspecified deep veins of unspecified lower extremity: I82.409

## 2016-01-07 MED ORDER — RIVAROXABAN 20 MG PO TABS: 20.0000 mg | ORAL_TABLET | Freq: Every day | ORAL | 4 refills | Status: DC

## 2016-01-07 MED ORDER — TRAMADOL HCL 50 MG PO TABS: 50.0000 mg | ORAL_TABLET | Freq: Two times a day (BID) | ORAL | 0 refills | Status: DC | PRN

## 2016-01-07 NOTE — Progress Notes (Signed)
Subjective:  Patient ID: Barbara Thornton, female    DOB: [birth year]  Age: 40 y.o. MRN: [id number]  CC: blood clot (right leg)   HPI Barbara Thornton is a 40 year old female who presents today to get established. She was seen at the ED yesterday for right leg pain and swelling and was diagnosed with right DVT after a lower extremity Doppler revealed acute DVT involving, atraumatic, external a little, saphenofemoral junction, common femoral, femoral, popliteal, posterior tibial and peroneal veins of the right lower extremity. She had been taking oral contraceptive pills prior to this but denies recent long travels or recent surgery.  She was discharged on Xarelto which she is currently taking and endorses moderate pain in her right lower extremity. She is unaware of a family history of blood clots in her family.  Past Medical History:  Diagnosis Date  . Anxiety     Past Surgical History:  Procedure Laterality Date  . CESAREAN SECTION    . TUBAL LIGATION      No Known Allergies   Outpatient Medications Prior to Visit  Medication Sig Dispense Refill  . citalopram (CELEXA) 20 MG tablet Take 20 mg by mouth daily.    . Rivaroxaban 15 & 20 MG TBPK Take as directed on package: Start with one 15mg  tablet by mouth twice a day with food. On Day 22, switch to one 20mg  tablet once a day with food. 51 each 0  . Biotin 1000 MCG tablet Take 1,000 mcg by mouth daily.    . cyclobenzaprine (FLEXERIL) 5 MG tablet Take 1 tablet (5 mg total) by mouth 3 (three) times daily as needed for muscle spasms. (Patient not taking: Reported on 01/07/2016) 20 tablet 0   No facility-administered medications prior to visit.     ROS Review of Systems  Constitutional: Negative for activity change, appetite change and fatigue.  HENT: Negative for congestion, sinus pressure and sore throat.   Eyes: Negative for visual disturbance.  Respiratory: Negative for cough, chest tightness, shortness of breath and  wheezing.   Cardiovascular: Negative for chest pain and palpitations.  Gastrointestinal: Negative for abdominal distention, abdominal pain and constipation.  Endocrine: Negative for polydipsia.  Genitourinary: Negative for dysuria and frequency.  Musculoskeletal:       Right leg pain  Skin: Negative for rash.  Neurological: Negative for tremors, light-headedness and numbness.  Hematological: Does not bruise/bleed easily.  Psychiatric/Behavioral: Negative for agitation and behavioral problems.    Objective:  BP 137/86 (BP Location: Right Arm, Patient Position: Sitting, Cuff Size: Small)   Pulse 87   Temp 97.9 F (36.6 C) (Oral)   Ht 5\' 2"  (1.575 m)   Wt 187 lb 6.4 oz (85 kg)   LMP 12/12/2015 (Exact Date)   SpO2 99%   BMI 34.28 kg/m   BP/Weight 01/07/2016 01/06/2016 [id number]  Systolic BP 0000000 [id number]  Diastolic BP 86 95 93  Wt. (Lbs) 187.4 180 -  BMI 34.28 32.92 -      Physical Exam  Constitutional: She is oriented to person, place, and time. She appears well-developed and well-nourished.  Cardiovascular: Normal rate, normal heart sounds and intact distal pulses.   No murmur heard. Pulmonary/Chest: Effort normal and breath sounds normal. She has no wheezes. She has no rales. She exhibits no tenderness.  Abdominal: Soft. Bowel sounds are normal. She exhibits no distension and no mass. There is no tenderness.  Musculoskeletal: Normal range of motion. She exhibits edema (edema of right leg and associated mild  tenderness in calf).  Neurological: She is alert and oriented to person, place, and time.     Assessment & Plan:   1. Acute deep vein thrombosis (DVT) of femoral vein of right lower extremity (HCC) DVT possibly triggered by use of OCP Anticoagulation for 6 months till 06/05/2016 Once anticoagulation is complete, I will send off hypercoagulable panel. Advised to avoid the use of NSAIDs and other medications that interact with anticoagulants.  Advised of possible  sedating effects of tramadol - rivaroxaban (XARELTO) 20 MG TABS tablet; Take 1 tablet (20 mg total) by mouth daily with supper. Commence on 01/28/16  Dispense: 30 tablet; Refill: 4 - traMADol (ULTRAM) 50 MG tablet; Take 1 tablet (50 mg total) by mouth every 12 (twelve) hours as needed.  Dispense: 30 tablet; Refill: 0   Meds ordered this encounter  Medications  . rivaroxaban (XARELTO) 20 MG TABS tablet    Sig: Take 1 tablet (20 mg total) by mouth daily with supper. Commence on 01/28/16    Dispense:  30 tablet    Refill:  4  . traMADol (ULTRAM) 50 MG tablet    Sig: Take 1 tablet (50 mg total) by mouth every 12 (twelve) hours as needed.    Dispense:  30 tablet    Refill:  0    Follow-up: Return in about 1 month (around 02/07/2016) for follow up on DVT.   Arnoldo Morale MD

## 2016-01-07 NOTE — Telephone Encounter (Signed)
Patient was seen in the ED yesterday 01/06/2016 for right leg swelling. Doppler ultrasound of right leg shows acute DVT involving common iliac, external iliac, saphenofemoral junction, common femoral, femoral, popliteal, posterior tibial, and peroneal veins of the right lower extremity (Results in EPIC). Patient was started on Xarelto starter kit and was advised to discontinue birth control. Patient reports she did not take OCP today. Has started Xarelto. Is going for follow up appointment this morning with the hospital. Requesting to be seen today with Dr.Jertson for evaluation and discussion of recommendations as she was taking OCP for cycle regulation. Appointment scheduled for today 01/07/2016 at 2 pm with Dr.Jertson. Patient is agreeable to date and time.  Routing to provider for final review. Patient agreeable to disposition. Will close encounter.

## 2016-01-07 NOTE — Progress Notes (Signed)
GYNECOLOGY  VISIT   HPI: 40 y.o.   Legally Separated  African American  female   [phone] with Patient's last menstrual period was 12/12/2015 (exact date).   here to discuss birth control options. She was recently seen in the ER for a DVT while on OCP's.She was discharged on Xarelto. She will be on anticoagulation for 6 months, then will have a hypercoagulable panel.  She is not a smoker, no family history of clotting disorders.  The patient was on OCP's for cycle control. She had menorrhagia with mild anemia prior to the pill. She had a normal ultrasound. She has had a tubal ligation.  She stopped her pill yesterday, started bleeding today, lightly.  GYNECOLOGIC HISTORY: Patient's last menstrual period was 12/12/2015 (exact date). Contraception: Tubal ligation. Menopausal hormone therapy: none        OB History    Gravida Para Term Preterm AB Living   4 2 2   2 2    SAB TAB Ectopic Multiple Live Births   2       2         Patient Active Problem List   Diagnosis Date Noted  . Right leg DVT ([place]) 01/07/2016    Past Medical History:  Diagnosis Date  . Anxiety   . DVT (deep venous thrombosis) ([place]) 01/07/2016   right leg    Past Surgical History:  Procedure Laterality Date  . CESAREAN SECTION    . TUBAL LIGATION      Current Outpatient Prescriptions  Medication Sig Dispense Refill  . citalopram (CELEXA) 20 MG tablet Take 20 mg by mouth daily.    Marland Kitchen MICROGESTIN FE 1/20 1-20 MG-MCG tablet     . rivaroxaban (XARELTO) 20 MG TABS tablet Take 1 tablet (20 mg total) by mouth daily with supper. Commence on 01/28/16 30 tablet 4  . Rivaroxaban 15 & 20 MG TBPK Take as directed on package: Start with one 15mg  tablet by mouth twice a day with food. On Day 22, switch to one 20mg  tablet once a day with food. 51 each 0  . traMADol (ULTRAM) 50 MG tablet Take 1 tablet (50 mg total) by mouth every 12 (twelve) hours as needed. 30 tablet 0  . Biotin 1000 MCG tablet Take 1,000 mcg by mouth daily.     . cyclobenzaprine (FLEXERIL) 5 MG tablet Take 1 tablet (5 mg total) by mouth 3 (three) times daily as needed for muscle spasms. (Patient not taking: Reported on 01/07/2016) 20 tablet 0   No current facility-administered medications for this visit.      ALLERGIES: Patient has no known allergies.  Family History  Problem Relation Age of Onset  . Hypertension Mother     Social History   Social History  . Marital status: Legally Separated    Spouse name: N/A  . Number of children: N/A  . Years of education: N/A   Occupational History  . Not on file.   Social History Main Topics  . Smoking status: Former Smoker    Packs/day: 0.50    Years: 17.00  . Smokeless tobacco: Former Systems developer    Quit date: 11/27/2012  . Alcohol use 0.6 oz/week    1 Shots of liquor per week     Comment: 1-2 per month  . Drug use: No  . Sexual activity: Yes    Partners: Male   Other Topics Concern  . Not on file   Social History Narrative  . No narrative on file  Review of Systems  Constitutional: Negative.   HENT: Negative.   Respiratory: Positive for cough.   Gastrointestinal: Negative.   Genitourinary: Negative.   Musculoskeletal:       Right leg pain   Skin: Negative.   Neurological: Negative.   Endo/Heme/Allergies: Negative.   Psychiatric/Behavioral: Negative.     PHYSICAL EXAMINATION:    BP 140/88 (BP Location: Right Arm, Patient Position: Sitting, Cuff Size: Normal)   Pulse 84   Resp 16   Wt 187 lb (84.8 kg)   LMP 12/12/2015 (Exact Date)   BMI 34.20 kg/m     General appearance: alert, cooperative and appears stated age Right leg is swollen.   ASSESSMENT Recent DVT while on OCP's On OCP's for a h/o menorrhagia and mild anemia    PLAN Recommend she have a mirena IUD, also discussed the option of endometrial ablation She would like a mirena, risks reviewed, information given She is not to take Ibuprofen prior to insertion, she is on Xarelto    An After Visit Summary  was printed and given to the patient.  15 minutes face to face time of which over 50% was spent in counseling.

## 2016-01-07 NOTE — Patient Instructions (Signed)
Deep Vein Thrombosis A deep vein thrombosis (DVT) is a blood clot (thrombus) that usually occurs in a deep, larger vein of the lower leg or the pelvis, or in an upper extremity such as the arm. These are dangerous and can lead to serious and even life-threatening complications if the clot travels to the lungs. A DVT can damage the valves in your leg veins so that instead of flowing upward, the blood pools in the lower leg. This is called post-thrombotic syndrome, and it can result in pain, swelling, discoloration, and sores on the leg. What are the causes? A DVT is caused by the formation of a blood clot in your leg, pelvis, or arm. Usually, several things contribute to the formation of blood clots. A clot may develop when:  Your blood flow slows down.  Your vein becomes damaged in some way.  You have a condition that makes your blood clot more easily.  What increases the risk? A DVT is more likely to develop in:  People who are older, especially over 60 years of age.  People who are overweight (obese).  People who sit or lie still for a long time, such as during long-distance travel (over 4 hours), bed rest, hospitalization, or during recovery from certain medical conditions like a stroke.  People who do not engage in much physical activity (sedentary lifestyle).  People who have chronic breathing disorders.  People who have a personal or family history of blood clots or blood clotting disease.  People who have peripheral vascular disease (PVD), diabetes, or some types of cancer.  People who have heart disease, especially if the person had a recent heart attack or has congestive heart failure.  People who have neurological diseases that affect the legs (leg paresis).  People who have had a traumatic injury, such as breaking a hip or leg.  People who have recently had major or lengthy surgery, especially on the hip, knee, or abdomen.  People who have had a central line placed  inside a large vein.  People who take medicines that contain the hormone estrogen. These include birth control pills and hormone replacement therapy.  Pregnancy or during childbirth or the postpartum period.  Long plane flights (over 8 hours).  What are the signs or symptoms?  Symptoms of a DVT can include:  Swelling of your leg or arm, especially if one side is much worse.  Warmth and redness of your leg or arm, especially if one side is much worse.  Pain in your arm or leg. If the clot is in your leg, symptoms may be more noticeable or worse when you stand or walk.  A feeling of pins and needles, if the clot is in the arm.  The symptoms of a DVT that has traveled to the lungs (pulmonary embolism, PE) usually start suddenly and include:  Shortness of breath while active or at rest.  Coughing or coughing up blood or blood-tinged mucus.  Chest pain that is often worse with deep breaths.  Rapid or irregular heartbeat.  Feeling light-headed or dizzy.  Fainting.  Feeling anxious.  Sweating.  There may also be pain and swelling in a leg if that is where the blood clot started. These symptoms may represent a serious problem that is an emergency. Do not wait to see if the symptoms will go away. Get medical help right away. Call your local emergency services (911 in the [place] Do not drive yourself to the hospital. How is this diagnosed? Your health   care provider will take a medical history and perform a physical exam. You may also have other tests, including:  Blood tests to assess the clotting properties of your blood.  Imaging tests, such as CT, ultrasound, MRI, X-ray, and other tests to see if you have clots anywhere in your body.  How is this treated? After a DVT is identified, it can be treated. The type of treatment that you receive depends on many factors, such as the cause of your DVT, your risk for bleeding or developing more clots, and other medical conditions that  you have. Sometimes, a combination of treatments is necessary. Treatment options may be combined and include:  Monitoring the blood clot with ultrasound.  Taking medicines by mouth, such as newer blood thinners (anticoagulants), thrombolytics, or warfarin.  Taking anticoagulant medicine by injection or through an IV tube.  Wearing compression stockings or using different types ofdevices.  Surgery (rare) to remove the blood clot or to place a filter in your abdomen to stop the blood clot from traveling to your lungs.  Treatments for a DVT are often divided into immediate treatment and long-term treatment (up to 3 months after DVT). You can work with your health care provider to choose the treatment program that is best for you. Follow these instructions at home: If you are taking a newer oral anticoagulant:  Take the medicine every single day at the same time each day.  Understand what foods and drugs interact with this medicine.  Understand that there are no regular blood tests required when using this medicine.  Understand the side effects of this medicine, including excessive bruising or bleeding. Ask your health care provider or pharmacist about other possible side effects. If you are taking warfarin:  Understand how to take warfarin and know which foods can affect how warfarin works in your body.  Understand that it is dangerous to take too much or too little warfarin. Too much warfarin increases the risk of bleeding. Too little warfarin continues to allow the risk for blood clots.  Follow your PT and INR blood testing schedule. The PT and INR results allow your health care provider to adjust your dose of warfarin. It is very important that you have your PT and INR tested as often as told by your health care provider.  Avoid major changes in your diet, or tell your health care provider before you change your diet. Arrange a visit with a registered dietitian to answer your  questions. Many foods, especially foods that are high in vitamin K, can interfere with warfarin and affect the PT and INR results. Eat a consistent amount of foods that are high in vitamin K, such as: ? Spinach, kale, broccoli, cabbage, collard greens, turnip greens, Brussels sprouts, peas, cauliflower, seaweed, and parsley. ? Beef liver and pork liver. ? Green tea. ? Soybean oil.  Tell your health care provider about any and all medicines, vitamins, and supplements that you take, including aspirin and other over-the-counter anti-inflammatory medicines. Be especially cautious with aspirin and anti-inflammatory medicines. Do not take those before you ask your health care provider if it is safe to do so. This is important because many medicines can interfere with warfarin and affect the PT and INR results.  Do not start or stop taking any over-the-counter or prescription medicine unless your health care provider or pharmacist tells you to do so. If you take warfarin, you will also need to do these things:  Hold pressure over cuts for longer than   usual.  Tell your dentist and other health care providers that you are taking warfarin before you have any procedures in which bleeding may occur.  Avoid alcohol or drink very small amounts. Tell your health care provider if you change your alcohol intake.  Do not use tobacco products, including cigarettes, chewing tobacco, and e-cigarettes. If you need help quitting, ask your health care provider.  Avoid contact sports.  General instructions  Take over-the-counter and prescription medicines only as told by your health care provider. Anticoagulant medicines can have side effects, including easy bruising and difficulty stopping bleeding. If you are prescribed an anticoagulant, you will also need to do these things: ? Hold pressure over cuts for longer than usual. ? Tell your dentist and other health care providers that you are taking anticoagulants  before you have any procedures in which bleeding may occur. ? Avoid contact sports.  Wear a medical alert bracelet or carry a medical alert card that says you have had a PE.  Ask your health care provider how soon you can go back to your normal activities. Stay active to prevent new blood clots from forming.  Make sure to exercise while traveling or when you have been sitting or standing for a long period of time. It is very important to exercise. Exercise your legs by walking or by tightening and relaxing your leg muscles often. Take frequent walks.  Wear compression stockings as told by your health care provider to help prevent more blood clots from forming.  Do not use tobacco products, including cigarettes, chewing tobacco, and e-cigarettes. If you need help quitting, ask your health care provider.  Keep all follow-up appointments with your health care provider. This is important. How is this prevented? Take these actions to decrease your risk of developing another DVT:  Exercise regularly. For at least 30 minutes every day, engage in: ? Activity that involves moving your arms and legs. ? Activity that encourages good blood flow through your body by increasing your heart rate.  Exercise your arms and legs every hour during long-distance travel (over 4 hours). Drink plenty of water and avoid drinking alcohol while traveling.  Avoid sitting or lying in bed for long periods of time without moving your legs.  Maintain a weight that is appropriate for your height. Ask your health care provider what weight is healthy for you.  If you are a woman who is over 35 years of age, avoid unnecessary use of medicines that contain estrogen. These include birth control pills.  Do not smoke, especially if you take estrogen medicines. If you need help quitting, ask your health care provider.  If you are hospitalized, prevention measures may include:  Early walking after surgery, as soon as your  health care provider says that it is safe.  Receiving anticoagulants to prevent blood clots.If you cannot take anticoagulants, other options may be available, such as wearing compression stockings or using different types of devices.  Get help right away if:  You have new or increased pain, swelling, or redness in an arm or leg.  You have numbness or tingling in an arm or leg.  You have shortness of breath while active or at rest.  You have chest pain.  You have a rapid or irregular heartbeat.  You feel light-headed or dizzy.  You cough up blood.  You notice blood in your vomit, bowel movement, or urine. These symptoms may represent a serious problem that is an emergency. Do not wait to see   if the symptoms will go away. Get medical help right away. Call your local emergency services (911 in the [place] Do not drive yourself to the hospital. This information is not intended to replace advice given to you by your health care provider. Make sure you discuss any questions you have with your health care provider. Document Released: 01/13/2005 Document Revised: 06/21/2015 Document Reviewed: 05/10/2014 Elsevier Interactive Patient Education  2017 [employer]  

## 2016-01-07 NOTE — Telephone Encounter (Signed)
Patient was seen in the ED last night and was diagnosed with blood clots in her legs that may be from her birth control.

## 2016-01-29 ENCOUNTER — Encounter: Payer: Self-pay | Admitting: Obstetrics and Gynecology

## 2016-01-29 ENCOUNTER — Ambulatory Visit (INDEPENDENT_AMBULATORY_CARE_PROVIDER_SITE_OTHER): Payer: BLUE CROSS/BLUE SHIELD | Admitting: Obstetrics and Gynecology

## 2016-01-29 VITALS — BP 122/80 | HR 80 | Resp 14 | Wt 184.0 lb

## 2016-01-29 DIAGNOSIS — Z30430 Encounter for insertion of intrauterine contraceptive device: Secondary | ICD-10-CM | POA: Diagnosis not present

## 2016-01-29 DIAGNOSIS — Z862 Personal history of diseases of the blood and blood-forming organs and certain disorders involving the immune mechanism: Secondary | ICD-10-CM

## 2016-01-29 DIAGNOSIS — N920 Excessive and frequent menstruation with regular cycle: Secondary | ICD-10-CM

## 2016-01-29 DIAGNOSIS — Z86718 Personal history of other venous thrombosis and embolism: Secondary | ICD-10-CM

## 2016-01-29 DIAGNOSIS — Z01812 Encounter for preprocedural laboratory examination: Secondary | ICD-10-CM

## 2016-01-29 HISTORY — PX: INTRAUTERINE DEVICE INSERTION: SHX323

## 2016-01-29 LAB — POCT URINE PREGNANCY: Preg Test, Ur: NEGATIVE

## 2016-01-29 NOTE — Addendum Note (Signed)
Addended by: Susanne Greenhouse E on: 01/29/2016 01:21 PM   Modules accepted: Orders

## 2016-01-29 NOTE — Progress Notes (Signed)
GYNECOLOGY  VISIT   HPI: 41 y.o.   Legally Separated  African American  female   [phone] with Patient's last menstrual period was 01/06/2016.   here for Mirena IUD insertion      GYNECOLOGIC HISTORY: Patient's last menstrual period was 01/06/2016. Contraception:none  Menopausal hormone therapy: none         OB History    Gravida Para Term Preterm AB Living   4 2 2   2 2    SAB TAB Ectopic Multiple Live Births   2       2         Patient Active Problem List   Diagnosis Date Noted  . Right leg DVT ([place]) 01/07/2016    Past Medical History:  Diagnosis Date  . Anxiety   . DVT (deep venous thrombosis) ([place]) 01/07/2016   right leg    Past Surgical History:  Procedure Laterality Date  . CESAREAN SECTION    . TUBAL LIGATION      Current Outpatient Prescriptions  Medication Sig Dispense Refill  . Biotin 1000 MCG tablet Take 1,000 mcg by mouth daily.    . citalopram (CELEXA) 20 MG tablet Take 20 mg by mouth daily.    . rivaroxaban (XARELTO) 20 MG TABS tablet Take 1 tablet (20 mg total) by mouth daily with supper. Commence on 01/28/16 30 tablet 4  . Rivaroxaban 15 & 20 MG TBPK Take as directed on package: Start with one 15mg  tablet by mouth twice a day with food. On Day 22, switch to one 20mg  tablet once a day with food. 51 each 0  . traMADol (ULTRAM) 50 MG tablet Take 1 tablet (50 mg total) by mouth every 12 (twelve) hours as needed. 30 tablet 0   No current facility-administered medications for this visit.      ALLERGIES: Patient has no known allergies.  Family History  Problem Relation Age of Onset  . Hypertension Mother     Social History   Social History  . Marital status: Legally Separated    Spouse name: N/A  . Number of children: N/A  . Years of education: N/A   Occupational History  . Not on file.   Social History Main Topics  . Smoking status: Former Smoker    Packs/day: 0.50    Years: 17.00  . Smokeless tobacco: Former Systems developer    Quit date: 11/27/2012   . Alcohol use 0.6 oz/week    1 Shots of liquor per week     Comment: 1-2 per month  . Drug use: No  . Sexual activity: Yes    Partners: Male   Other Topics Concern  . Not on file   Social History Narrative  . No narrative on file    Review of Systems  Constitutional: Negative.   HENT: Negative.   Eyes: Negative.   Respiratory: Negative.   Cardiovascular: Negative.   Gastrointestinal: Negative.   Genitourinary: Negative.   Musculoskeletal: Negative.   Skin: Negative.   Neurological: Negative.   Endo/Heme/Allergies: Negative.   Psychiatric/Behavioral: Negative.     PHYSICAL EXAMINATION:    BP 122/80 (BP Location: Right Arm, Patient Position: Sitting, Cuff Size: Normal)   Pulse 80   Resp 14   Wt 184 lb (83.5 kg)   LMP 01/06/2016   BMI 33.65 kg/m     General appearance: alert, cooperative and appears stated age  Pelvic: External genitalia:  no lesions  Urethra:  normal appearing urethra with no masses, tenderness or lesions              Bartholins and Skenes: normal                 Vagina: normal appearing vagina with normal color and discharge, no lesions              Cervix: no lesions               The risks of the mirena IUD were reviewed with the patient, including infection, abnormal bleeding and uterine perfortion. Consent was signed.  A speculum was placed in the vagina, the cervix was cleansed with betadine. A tenaculum was placed on the cervix, the uterus sounded to 8 cm. The cervix was dilated to a 5 Hagar dilator  The mirena IUD was inserted without difficulty. The string were cut to 3-4 cm. The tenaculum was removed. Slight oozing from the tenaculum site was stopped with pressure.   The patient tolerated the procedure well.    Chaperone was present for exam.  ASSESSMENT Mirena IUD insertion H/O menorrhagia with mild anemia     PLAN F/U next month for an annual exam and IUD check   An After Visit Summary was printed and given to  the patient.

## 2016-01-29 NOTE — Patient Instructions (Signed)

## 2016-01-30 LAB — GC/CHLAMYDIA PROBE AMP
CT Probe RNA: NOT DETECTED
GC PROBE AMP APTIMA: NOT DETECTED

## 2016-02-07 ENCOUNTER — Ambulatory Visit: Payer: BLUE CROSS/BLUE SHIELD | Attending: Family Medicine | Admitting: Family Medicine

## 2016-02-07 ENCOUNTER — Encounter: Payer: Self-pay | Admitting: Family Medicine

## 2016-02-07 VITALS — BP 134/77 | HR 78 | Temp 98.7°F | Ht 62.5 in | Wt 186.6 lb

## 2016-02-07 DIAGNOSIS — F419 Anxiety disorder, unspecified: Secondary | ICD-10-CM | POA: Diagnosis not present

## 2016-02-07 DIAGNOSIS — Z7901 Long term (current) use of anticoagulants: Secondary | ICD-10-CM | POA: Insufficient documentation

## 2016-02-07 DIAGNOSIS — Z79899 Other long term (current) drug therapy: Secondary | ICD-10-CM | POA: Diagnosis not present

## 2016-02-07 DIAGNOSIS — I82411 Acute embolism and thrombosis of right femoral vein: Secondary | ICD-10-CM

## 2016-02-07 NOTE — Progress Notes (Signed)
Subjective:    Patient ID: Barbara Thornton, female    DOB: 1975/06/26, 41 y.o.   MRN: [id number]  HPI Barbara Thornton is a 41 year old female who presents For follow-up a visit She is on Xarelto for right leg DVT involving the common iliac, external iliac, saphenofemoral junction, common femoral, femoral, popliteal, posterior tibial and peroneal veins of the right lower extremity. She had been taking oral contraceptive pills prior to this but denies recent long travels or recent surgery.  The pain and swelling she had complained of at her last office visit have resolved. She is unaware of a family history of blood clots in her family.  Past Medical History:  Diagnosis Date  . Anxiety   . DVT (deep venous thrombosis) ([place]) 01/07/2016   right leg    Past Surgical History:  Procedure Laterality Date  . CESAREAN SECTION    . TUBAL LIGATION      No Known Allergies  Current Outpatient Prescriptions on File Prior to Visit  Medication Sig Dispense Refill  . citalopram (CELEXA) 20 MG tablet Take 20 mg by mouth daily.    . rivaroxaban (XARELTO) 20 MG TABS tablet Take 1 tablet (20 mg total) by mouth daily with supper. Commence on 01/28/16 30 tablet 4  . traMADol (ULTRAM) 50 MG tablet Take 1 tablet (50 mg total) by mouth every 12 (twelve) hours as needed. 30 tablet 0   No current facility-administered medications on file prior to visit.       Review of Systems Constitutional: Negative for activity change, appetite change and fatigue.  HENT: Negative for congestion, sinus pressure and sore throat.   Eyes: Negative for visual disturbance.  Respiratory: Negative for cough, chest tightness, shortness of breath and wheezing.   Cardiovascular: Negative for chest pain and palpitations.  Gastrointestinal: Negative for abdominal distention, abdominal pain and constipation.  Endocrine: Negative for polydipsia.  Genitourinary: Negative for dysuria and frequency.  Musculoskeletal: no leg  pain      Skin: Negative for rash.  Neurological: Negative for tremors, light-headedness and numbness.  Hematological: Does not bruise/bleed easily.  Psychiatric/Behavioral: Negative for agitation and behavioral problems.     Objective: Vitals:   02/07/16 0944  BP: 134/77  Pulse: 78  Temp: 98.7 F (37.1 C)  TempSrc: Oral  SpO2: 97%  Weight: 186 lb 9.6 oz (84.6 kg)  Height: 5' 2.5" (1.588 m)      Physical Exam Constitutional: She is oriented to person, place, and time. She appears well-developed and well-nourished.  Cardiovascular: Normal rate, normal heart sounds and intact distal pulses.   No murmur heard. Pulmonary/Chest: Effort normal and breath sounds normal. She has no wheezes. She has no rales. She exhibits no tenderness.  Abdominal: Soft. Bowel sounds are normal. She exhibits no distension and no mass. There is no tenderness.  Musculoskeletal: Normal range of motion. She exhibits no leg pan or edema Neurological: She is alert and oriented to person, place, and time.         Assessment & Plan:  1. Acute deep vein thrombosis (DVT) of femoral vein of right lower extremity (HCC) DVT possibly provoked by use of OCP Anticoagulation for 6 months till 06/05/2016 Once anticoagulation is complete, I will send off hypercoagulable panel. Advised to avoid the use of NSAIDs and other medications that interact with anticoagulants.  Advised of possible sedating effects of tramadol - rivaroxaban (XARELTO) 20 MG TABS tablet; Take 1 tablet (20 mg total) by mouth daily with supper. Commence on  01/28/16  Dispense: 30 tablet; Refill: 4 - traMADol (ULTRAM) 50 MG tablet; Take 1 tablet (50 mg total) by mouth every 12 (twelve) hours as needed.  Dispense: 30 tablet; Refill: 0

## 2016-02-18 ENCOUNTER — Telehealth: Payer: Self-pay | Admitting: Obstetrics and Gynecology

## 2016-02-18 NOTE — Telephone Encounter (Signed)
Spoke with patient. Patient states she had Mirena IUD placed 01/29/16. Patient states LMP 1/4 and has not stopped. Patient reports bleeding has been like a normal cycle, changing tampon q4hrs. Patient denies any pain, nausea, fatigue. Patient states she thought she would not be bleeding at this point. Advised patient can take up to 3 months for cycles to regulate and may still experience bleeding with IUD. Advised bleeding may not be as heavy with IUD or may not experience bleeding at all. Patient states she is still taking Xarelto for hx DVT. Advised patient to continue to monitor bleeding. Advised to return call to office if bleeding becomes heavy -changing tampon q1-2 hrs or if develops severe pain. Patient states she has AEX 03/12/16. Advised patient will review with Dr. Talbert Nan and return call with any additional recommendations, patient is agreeable.  Dr. Talbert Nan -any additional recommendations?

## 2016-02-18 NOTE — Telephone Encounter (Signed)
Patient is having some bleeding after her IUD insertion.

## 2016-02-19 NOTE — Telephone Encounter (Signed)
No further recommendations. Will close encounter.

## 2016-03-12 ENCOUNTER — Ambulatory Visit (INDEPENDENT_AMBULATORY_CARE_PROVIDER_SITE_OTHER): Payer: BLUE CROSS/BLUE SHIELD | Admitting: Obstetrics and Gynecology

## 2016-03-12 ENCOUNTER — Encounter: Payer: Self-pay | Admitting: Obstetrics and Gynecology

## 2016-03-12 VITALS — BP 122/78 | HR 68 | Resp 14 | Ht 62.5 in | Wt 189.0 lb

## 2016-03-12 DIAGNOSIS — Z01419 Encounter for gynecological examination (general) (routine) without abnormal findings: Secondary | ICD-10-CM

## 2016-03-12 DIAGNOSIS — Z0000 Encounter for general adult medical examination without abnormal findings: Secondary | ICD-10-CM

## 2016-03-12 DIAGNOSIS — Z30431 Encounter for routine checking of intrauterine contraceptive device: Secondary | ICD-10-CM | POA: Diagnosis not present

## 2016-03-12 DIAGNOSIS — E559 Vitamin D deficiency, unspecified: Secondary | ICD-10-CM | POA: Diagnosis not present

## 2016-03-12 LAB — COMPREHENSIVE METABOLIC PANEL
ALK PHOS: 41 U/L (ref 33–115)
ALT: 16 U/L (ref 6–29)
AST: 25 U/L (ref 10–30)
Albumin: 4.1 g/dL (ref 3.6–5.1)
BUN: 12 mg/dL (ref 7–25)
CO2: 25 mmol/L (ref 20–31)
Calcium: 9.3 mg/dL (ref 8.6–10.2)
Chloride: 105 mmol/L (ref 98–110)
Creat: 0.75 mg/dL (ref 0.50–1.10)
GLUCOSE: 90 mg/dL (ref 65–99)
POTASSIUM: 4.2 mmol/L (ref 3.5–5.3)
Sodium: 140 mmol/L (ref 135–146)
Total Bilirubin: 0.7 mg/dL (ref 0.2–1.2)
Total Protein: 7.1 g/dL (ref 6.1–8.1)

## 2016-03-12 LAB — CBC
HCT: 37.9 % (ref 35.0–45.0)
Hemoglobin: 12.2 g/dL (ref 11.7–15.5)
MCH: 28 pg (ref 27.0–33.0)
MCHC: 32.2 g/dL (ref 32.0–36.0)
MCV: 86.9 fL (ref 80.0–100.0)
MPV: 9.8 fL (ref 7.5–12.5)
PLATELETS: 310 10*3/uL (ref 140–400)
RBC: 4.36 MIL/uL (ref 3.80–5.10)
RDW: 14.7 % (ref 11.0–15.0)
WBC: 5.3 10*3/uL (ref 3.8–10.8)

## 2016-03-12 MED ORDER — CITALOPRAM HYDROBROMIDE 20 MG PO TABS: 20.0000 mg | ORAL_TABLET | Freq: Every day | ORAL | 3 refills | Status: DC

## 2016-03-12 NOTE — Progress Notes (Signed)
41 y.o. [id number] Legally SeparatedAfrican AmericanF here for annual exam.  She developed a DVT on OCP's, had a mirena placed last month for menorrhagia and h/o anemia prior to OCP's.  Period Duration (Days): been bleeding since IUD insertion 01/29/16 Period Pattern: (!) Irregular Menstrual Flow: Light Menstrual Control: Tampon Menstrual Control Change Freq (Hours): changes tampon twice a day  Dysmenorrhea: None  She having light bleeding daily, using 3 tampons in 24 hours.  Sexually active, no pain with intercourse.   Patient's last menstrual period was 01/29/2016.          Sexually active: Yes.    The current method of family planning is IUD.    Exercising: Yes.    cardio/ weight lifting/ yoga Smoker:  Former smoker  Health Maintenance: Pap:  03-07-15 WNL NEG HR HPV History of abnormal Pap:  no MMG:  Never Colonoscopy:  Never BMD:   Never TDaP:  03-07-15 Gardasil: N/A   reports that she has quit smoking. She has a 8.50 pack-year smoking history. She quit smokeless tobacco use about 3 years ago. She reports that she drinks about 1.2 oz of alcohol per week . 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 Youngest daughter is a Museum/gallery exhibitions officer at [employer], wants to be a PT, oldest is a Quarry manager. Daughters are 68 and 5. Both daughters are at home.   Past Medical History:  Diagnosis Date  . Anxiety   . DVT (deep venous thrombosis) ([place]) 01/07/2016   right leg    Past Surgical History:  Procedure Laterality Date  . CESAREAN SECTION    . INTRAUTERINE DEVICE INSERTION  01/29/2016   Mirena  . TUBAL LIGATION      Current Outpatient Prescriptions  Medication Sig Dispense Refill  . citalopram (CELEXA) 20 MG tablet Take 20 mg by mouth daily.    Marland Kitchen levonorgestrel (MIRENA) 20 MCG/24HR IUD 1 each by Intrauterine route once.    . rivaroxaban (XARELTO) 20 MG TABS tablet Take 1 tablet (20 mg total) by mouth daily with supper. Commence on 01/28/16 30 tablet 4   No  current facility-administered medications for this visit.     Family History  Problem Relation Age of Onset  . Hypertension Mother     Review of Systems  Constitutional: Negative.   HENT: Negative.   Eyes: Negative.   Respiratory: Negative.   Cardiovascular: Negative.   Gastrointestinal: Negative.   Endocrine: Negative.   Genitourinary: Positive for menstrual problem.       Continuous Bleeding with IUD  Musculoskeletal: Negative.   Skin: Negative.   Allergic/Immunologic: Negative.   Neurological: Negative.   Psychiatric/Behavioral: Negative.     Exam:   BP 122/78 (BP Location: Right Arm, Patient Position: Sitting, Cuff Size: Normal)   Pulse 68   Resp 14   Ht 5' 2.5" (1.588 m)   Wt 189 lb (85.7 kg)   LMP 01/29/2016   BMI 34.02 kg/m   Weight change: @WEIGHTCHANGE @ Height:   Height: 5' 2.5" (158.8 cm)  Ht Readings from Last 3 Encounters:  03/12/16 5' 2.5" (1.588 m)  02/07/16 5' 2.5" (1.588 m)  01/07/16 5\' 2"  (1.575 m)    General appearance: alert, cooperative and appears stated age Head: Normocephalic, without obvious abnormality, atraumatic Neck: no adenopathy, supple, symmetrical, trachea midline and thyroid normal to inspection and palpation Lungs: clear to auscultation bilaterally Cardiovascular: regular rate and rhythm Breasts: normal appearance, no masses or tenderness Heart: regular rate and  rhythm Abdomen: soft, non-tender; bowel sounds normal; no masses,  no organomegaly Extremities: extremities normal, atraumatic, no cyanosis or edema Skin: Skin color, texture, turgor normal. No rashes or lesions Lymph nodes: Cervical, supraclavicular, and axillary nodes normal. No abnormal inguinal nodes palpated Neurologic: Grossly normal   Pelvic: External genitalia:  no lesions              Urethra:  normal appearing urethra with no masses, tenderness or lesions              Bartholins and Skenes: normal                 Vagina: normal appearing vagina with normal  color and discharge, no lesions              Cervix: no lesions, IUD string 2 cm               Bimanual Exam:  Uterus:  normal size, contour, position, consistency, mobility, non-tender              Adnexa: no mass, fullness, tenderness               Rectovaginal: Confirms               Anus:  normal sphincter tone, no lesions  Chaperone was present for exam.  A:  Well Woman with normal exam  H/O menorrhagia, mirena placed last month  H/O DVT on Xarelto  H/O vit d def  P:   No pap this year  Screening labs (lipids not checked, normal last year)  Mammogram  Discussed breast self exam  Discussed calcium and vit D intake

## 2016-03-12 NOTE — Patient Instructions (Addendum)
EXERCISE AND DIET:  We recommended that you start or continue a regular exercise program for good health. Regular exercise means any activity that makes your heart beat faster and makes you sweat.  We recommend exercising at least 30 minutes per day at least 3 days a week, preferably 4 or 5.  We also recommend a diet low in fat and sugar.  Inactivity, poor dietary choices and obesity can cause diabetes, heart attack, stroke, and kidney damage, among others.    ALCOHOL AND SMOKING:  Women should limit their alcohol intake to no more than 7 drinks/beers/glasses of wine (combined, not each!) per week. Moderation of alcohol intake to this level decreases your risk of breast cancer and liver damage. And of course, no recreational drugs are part of a healthy lifestyle.  And absolutely no smoking or even second hand smoke. Most people know smoking can cause heart and lung diseases, but did you know it also contributes to weakening of your bones? Aging of your skin?  Yellowing of your teeth and nails?  CALCIUM AND VITAMIN D:  Adequate intake of calcium and Vitamin D are recommended.  The recommendations for exact amounts of these supplements seem to change often, but generally speaking 600 mg of calcium (either carbonate or citrate) and 800 units of Vitamin D per day seems prudent. Certain women may benefit from higher intake of Vitamin D.  If you are among these women, your doctor will have told you during your visit.    PAP SMEARS:  Pap smears, to check for cervical cancer or precancers,  have traditionally been done yearly, although recent scientific advances have shown that most women can have pap smears less often.  However, every woman still should have a physical exam from her gynecologist every year. It will include a breast check, inspection of the vulva and vagina to check for abnormal growths or skin changes, a visual exam of the cervix, and then an exam to evaluate the size and shape of the uterus and  ovaries.  And after 40 years of age, a rectal exam is indicated to check for rectal cancers. We will also provide age appropriate advice regarding health maintenance, like when you should have certain vaccines, screening for sexually transmitted diseases, bone density testing, colonoscopy, mammograms, etc.   MAMMOGRAMS:  All women over 40 years old should have a yearly mammogram. Many facilities now offer a "3D" mammogram, which may cost around $50 extra out of pocket. If possible,  we recommend you accept the option to have the 3D mammogram performed.  It both reduces the number of women who will be called back for extra views which then turn out to be normal, and it is better than the routine mammogram at detecting truly abnormal areas.    COLONOSCOPY:  Colonoscopy to screen for colon cancer is recommended for all women at age 50.  We know, you hate the idea of the prep.  We agree, BUT, having colon cancer and not knowing it is worse!!  Colon cancer so often starts as a polyp that can be seen and removed at colonscopy, which can quite literally save your life!  And if your first colonoscopy is normal and you have no family history of colon cancer, most women don't have to have it again for 10 years.  Once every ten years, you can do something that may end up saving your life, right?  We will be happy to help you get it scheduled when you are ready.    Be sure to check your insurance coverage so you understand how much it will cost.  It may be covered as a preventative service at no cost, but you should check your particular policy.      Breast Self-Awareness Introduction Breast self-awareness means being familiar with how your breasts look and feel. It involves checking your breasts regularly and reporting any changes to your health care provider. Practicing breast self-awareness is important. A change in your breasts can be a sign of a serious medical problem. Being familiar with how your breasts look  and feel allows you to find any problems early, when treatment is more likely to be successful. All women should practice breast self-awareness, including women who have had breast implants. How to do a breast self-exam One way to learn what is normal for your breasts and whether your breasts are changing is to do a breast self-exam. To do a breast self-exam: Look for Changes  1. Remove all the clothing above your waist. 2. Stand in front of a mirror in a room with good lighting. 3. Put your hands on your hips. 4. Push your hands firmly downward. 5. Compare your breasts in the mirror. Look for differences between them (asymmetry), such as:  Differences in shape.  Differences in size.  Puckers, dips, and bumps in one breast and not the other. 6. Look at each breast for changes in your skin, such as:  Redness.  Scaly areas. 7. Look for changes in your nipples, such as:  Discharge.  Bleeding.  Dimpling.  Redness.  A change in position. Feel for Changes  Carefully feel your breasts for lumps and changes. It is best to do this while lying on your back on the floor and again while sitting or standing in the shower or tub with soapy water on your skin. Feel each breast in the following way:  Place the arm on the side of the breast you are examining above your head.  Feel your breast with the other hand.  Start in the nipple area and make  inch (2 cm) overlapping circles to feel your breast. Use the pads of your three middle fingers to do this. Apply light pressure, then medium pressure, then firm pressure. The light pressure will allow you to feel the tissue closest to the skin. The medium pressure will allow you to feel the tissue that is a little deeper. The firm pressure will allow you to feel the tissue close to the ribs.  Continue the overlapping circles, moving downward over the breast until you feel your ribs below your breast.  Move one finger-width toward the center of  the body. Continue to use the  inch (2 cm) overlapping circles to feel your breast as you move slowly up toward your collarbone.  Continue the up and down exam using all three pressures until you reach your armpit. Write Down What You Find  Write down what is normal for each breast and any changes that you find. Keep a written record with breast changes or normal findings for each breast. By writing this information down, you do not need to depend only on memory for size, tenderness, or location. Write down where you are in your menstrual cycle, if you are still menstruating. If you are having trouble noticing differences in your breasts, do not get discouraged. With time you will become more familiar with the variations in your breasts and more comfortable with the exam. How often should I examine my breasts? Examine  your breasts every month. If you are breastfeeding, the best time to examine your breasts is after a feeding or after using a breast pump. If you menstruate, the best time to examine your breasts is 5-7 days after your period is over. During your period, your breasts are lumpier, and it may be more difficult to notice changes. When should I see my health care provider? See your health care provider if you notice:  A change in shape or size of your breasts or nipples.  A change in the skin of your breast or nipples, such as a reddened or scaly area.  Unusual discharge from your nipples.  A lump or thick area that was not there before.  Pain in your breasts.  Anything that concerns you. This information is not intended to replace advice given to you by your health care provider. Make sure you discuss any questions you have with your health care provider. Document Released: 01/13/2005 Document Revised: 06/21/2015 Document Reviewed: 12/03/2014  2017 Elsevier

## 2016-03-13 ENCOUNTER — Telehealth: Payer: Self-pay | Admitting: *Deleted

## 2016-03-13 LAB — VITAMIN D 25 HYDROXY (VIT D DEFICIENCY, FRACTURES): Vit D, 25-Hydroxy: 16 ng/mL — ABNORMAL LOW (ref 30–100)

## 2016-03-13 NOTE — Telephone Encounter (Signed)
-----   Message from [name], MD sent at 03/13/2016  2:20 PM EST ----- Her vit d has dropped back down. She should start taking 2,000 IU of vit D 3 daily and have her level rechecked in 3 months. I'm sure she is going to need to be on long term vit D.

## 2016-03-13 NOTE — Telephone Encounter (Signed)
I know its very annoying, but it is normal to have bleeding for the first 3 months after IUD insertion (occasionally longer). Unfortunately because of her clotting history I'm limited in my ability to stop her bleeding. She isn't anemic. I would try to encourage her to hang in there, it should get better with some more time.

## 2016-03-13 NOTE — Telephone Encounter (Signed)
Spoke with patient and gave results. Made 3 month F/U to repeat Vitamin D. She is requesting something to stop her period. She is feeling tired all the time.  She has been bleeding for 45 days. Please advise

## 2016-03-14 NOTE — Telephone Encounter (Signed)
Left message to call Kaitlyn at [phone]. 

## 2016-03-14 NOTE — Telephone Encounter (Signed)
Spoke with patient. Advised of message as seen below from Dr.Jertson. Patient is agreeable and verbalizes understanding.  Routing to provider for final review. Patient agreeable to disposition. Will close encounter.  

## 2016-03-14 NOTE — Telephone Encounter (Signed)
Patient returning your call.

## 2016-03-26 ENCOUNTER — Ambulatory Visit (INDEPENDENT_AMBULATORY_CARE_PROVIDER_SITE_OTHER): Payer: BLUE CROSS/BLUE SHIELD | Admitting: Physician Assistant

## 2016-03-26 VITALS — BP 122/81 | HR 81 | Temp 98.9°F | Resp 17 | Ht 63.5 in | Wt 188.0 lb

## 2016-03-26 DIAGNOSIS — J029 Acute pharyngitis, unspecified: Secondary | ICD-10-CM

## 2016-03-26 LAB — POCT CBC
Granulocyte percent: 77.4 %G (ref 37–80)
HCT, POC: 36.3 % — AB (ref 37.7–47.9)
Hemoglobin: 12.4 g/dL (ref 12.2–16.2)
LYMPH, POC: 1.9 (ref 0.6–3.4)
MCH, POC: 28.3 pg (ref 27–31.2)
MCHC: 34.3 g/dL (ref 31.8–35.4)
MCV: 82.6 fL (ref 80–97)
MID (CBC): 0.5 (ref 0–0.9)
MPV: 7.9 fL (ref 0–99.8)
PLATELET COUNT, POC: 251 10*3/uL (ref 142–424)
POC Granulocyte: 8.3 — AB (ref 2–6.9)
POC LYMPH %: 18.1 % (ref 10–50)
POC MID %: 4.5 %M (ref 0–12)
RBC: 4.39 M/uL (ref 4.04–5.48)
RDW, POC: 14.3 %
WBC: 10.7 10*3/uL — AB (ref 4.6–10.2)

## 2016-03-26 LAB — POCT RAPID STREP A (OFFICE): RAPID STREP A SCREEN: NEGATIVE

## 2016-03-26 MED ORDER — MAGIC MOUTHWASH W/LIDOCAINE: 10.0000 mL | ORAL | 0 refills | Status: DC | PRN

## 2016-03-26 NOTE — Progress Notes (Signed)
MRN: [id number] DOB: Jun 11, 1975 Subjective:   Barbara Thornton is a 41 y.o. female presenting for chief complaint of Sore Throat and Headache .  Reports 2 day history of sore throat and headache. Has some associated fatigue.  Has tried chloraseptic with no relief. Denies fever, sinus congestion, rhinorrhea, ear pain, wheezing, shortness of breath, chest tightness, myalgia and cough, chills, nausea, vomiting, abdominal pain, diarrhea and difficulty breathing and voice changes.. Has not had sick contact with anyone. No history of seasonal allergies or asthma. Patient has had flu shot this season. Denies smoking. Denies any other aggravating or relieving factors, no other questions or concerns.  [name] has a current medication list which includes the following prescription(s): citalopram, levonorgestrel, rivaroxaban, and magic mouthwash w/lidocaine. Also has No Known Allergies.  Barbara Thornton  has a past medical history of Anxiety and DVT (deep venous thrombosis) ([place]) (01/07/2016). Also  has a past surgical history that includes Cesarean section; Tubal ligation; and Intrauterine device insertion (01/29/2016).   Objective:   Vitals: BP 122/81 (BP Location: Right Arm, Patient Position: Sitting, Cuff Size: Normal)   Pulse 81   Temp 98.9 F (37.2 C) (Oral)   Resp 17   Ht 5' 3.5" (1.613 m)   Wt 188 lb (85.3 kg)   LMP 01/29/2016 (Approximate)   SpO2 97%   BMI 32.78 kg/m   Physical Exam  Constitutional: She is oriented to person, place, and time. She appears well-developed and well-nourished.  HENT:  Head: Normocephalic and atraumatic.  Right Ear: Tympanic membrane, external ear and ear canal normal.  Left Ear: Tympanic membrane, external ear and ear canal normal.  Nose: No mucosal edema or rhinorrhea.  Mouth/Throat: Uvula is midline and oropharynx is clear and moist. Tonsils are 2+ on the right. Tonsils are 2+ on the left. No tonsillar exudate.  Eyes: Conjunctivae are normal.  Neck: Normal  range of motion.  Cardiovascular: Normal rate, regular rhythm and normal heart sounds.   Pulmonary/Chest: Effort normal and breath sounds normal.  Lymphadenopathy:       Head (right side): No submental, no submandibular, no tonsillar, no preauricular, no posterior auricular and no occipital adenopathy present.       Head (left side): No submental, no submandibular, no tonsillar, no preauricular, no posterior auricular and no occipital adenopathy present.    She has cervical adenopathy.       Right cervical: Superficial cervical and posterior cervical adenopathy present. No deep cervical adenopathy present.      Left cervical: No superficial cervical, no deep cervical and no posterior cervical adenopathy present.       Right: No supraclavicular adenopathy present.       Left: No supraclavicular adenopathy present.  Neurological: She is alert and oriented to person, place, and time.  Skin: Skin is warm and dry.  Psychiatric: She has a normal mood and affect.  Vitals reviewed.   Results for orders placed or performed in visit on 03/26/16 (from the past 24 hour(s))  POCT rapid strep A     Status: None   Collection Time: 03/26/16 11:59 AM  Result Value Ref Range   Rapid Strep A Screen Negative Negative  POCT CBC     Status: Abnormal   Collection Time: 03/26/16 12:21 PM  Result Value Ref Range   WBC 10.7 (A) 4.6 - 10.2 K/uL   Lymph, poc 1.9 0.6 - 3.4   POC LYMPH PERCENT 18.1 10 - 50 %L   MID (cbc) 0.5 0 - 0.9  POC MID % 4.5 0 - 12 %M   POC Granulocyte 8.3 (A) 2 - 6.9   Granulocyte percent 77.4 37 - 80 %G   RBC 4.39 4.04 - 5.48 M/uL   Hemoglobin 12.4 12.2 - 16.2 g/dL   HCT, POC 36.3 (A) 37.7 - 47.9 %   MCV 82.6 80 - 97 fL   MCH, POC 28.3 27 - 31.2 pg   MCHC 34.3 31.8 - 35.4 g/dL   RDW, POC 14.3 %   Platelet Count, POC 251 142 - 424 K/uL   MPV 7.9 0 - 99.8 fL    Assessment and Plan :  1. Sore throat Labs pending. Instructed that if she develops worsening sore throat, fever,  chills, or difficulty breathing, seek care immediately at the ED.  - POCT rapid strep A - Culture, Group A Strep - POCT CBC - Epstein-Barr virus VCA antibody panel Meds ordered this encounter  Medications  . magic mouthwash w/lidocaine SOLN    Sig: Take 10 mLs by mouth every 2 (two) hours as needed for mouth pain.    Dispense:  360 mL    Refill:  0    Ok to substitute your facility's mouth wash recipe, mix 1:1 with viscous lidocaine    Order Specific Question:   Supervising Provider    Answer:   Wardell Honour [[id number]]    Tenna Delaine, PA-C  [facility] 03/26/2016 12:33 PM

## 2016-03-26 NOTE — Patient Instructions (Addendum)
Use the magic mouthwash for sore throat. I will have your culture back and blood work in two days. I will let you know then what we are going to do for further treatment.   If you develop worsening sore throat, fever, chills, or difficulty breathing, seek care immediately at the ED.   Continue to drink lots of fluids, rest, and drink smoothies that are not irritating to the throat.    Sore Throat When you have a sore throat, your throat may:  Hurt.  Burn.  Feel irritated.  Feel scratchy. Many things can cause a sore throat, including:  An infection.  Allergies.  Dryness in the air.  Smoke or pollution.  Gastroesophageal reflux disease (GERD).  A tumor. A sore throat can be the first sign of another sickness. It can happen with other problems, like coughing or a fever. Most sore throats go away without treatment. Follow these instructions at home:  Take over-the-counter medicines only as told by your doctor.  Drink enough fluids to keep your pee (urine) clear or pale yellow.  Rest when you feel you need to.  To help with pain, try:  Sipping warm liquids, such as broth, herbal tea, or warm water.  Eating or drinking cold or frozen liquids, such as frozen ice pops.  Gargling with a salt-water mixture 3-4 times a day or as needed. To make a salt-water mixture, add -1 tsp of salt in 1 cup of warm water. Mix it until you cannot see the salt anymore.  Sucking on hard candy or throat lozenges.  Putting a cool-mist humidifier in your bedroom at night.  Sitting in the bathroom with the door closed for 5-10 minutes while you run hot water in the shower.  Do not use any tobacco products, such as cigarettes, chewing tobacco, and e-cigarettes. If you need help quitting, ask your doctor. Contact a doctor if:  You have a fever for more than 2-3 days.  You keep having symptoms for more than 2-3 days.  Your throat does not get better in 7 days.  You have a fever and your  symptoms suddenly get worse. Get help right away if:  You have trouble breathing.  You cannot swallow fluids, soft foods, or your saliva.  You have swelling in your throat or neck that gets worse.  You keep feeling like you are going to throw up (vomit).  You keep throwing up. This information is not intended to replace advice given to you by your health care provider. Make sure you discuss any questions you have with your health care provider. Document Released: 10/23/2007 Document Revised: 09/09/2015 Document Reviewed: 11/03/2014 Elsevier Interactive Patient Education  2017 [employer].   IF you received an x-ray today, you will receive an invoice from [facility]. Please contact [facility] at [phone] with questions or concerns regarding your invoice.   IF you received labwork today, you will receive an invoice from [facility]. Please contact [facility] at [phone] with questions or concerns regarding your invoice.   Our billing staff will not be able to assist you with questions regarding bills from these companies.  You will be contacted with the lab results as soon as they are available. The fastest way to get your results is to activate your My Chart account. Instructions are located on the last page of this paperwork. If you have not heard from [place] regarding the results in 2 weeks, please contact this office.

## 2016-03-28 ENCOUNTER — Telehealth: Payer: Self-pay | Admitting: Physician Assistant

## 2016-03-28 LAB — EPSTEIN-BARR VIRUS VCA ANTIBODY PANEL
EBV Early Antigen Ab, IgG: 19.9 U/mL — ABNORMAL HIGH (ref 0.0–8.9)
EBV NA IGG: 346 U/mL — AB (ref 0.0–17.9)
EBV VCA IGG: 517 U/mL — AB (ref 0.0–17.9)
EBV VCA IgM: <36 U/mL (ref 0.0–35.9)

## 2016-03-28 LAB — CULTURE, GROUP A STREP

## 2016-03-28 NOTE — Telephone Encounter (Signed)
Pt called to discuss lab results. No answer.

## 2016-04-01 ENCOUNTER — Encounter: Payer: Self-pay | Admitting: Physician Assistant

## 2016-04-01 ENCOUNTER — Other Ambulatory Visit: Payer: Self-pay | Admitting: Physician Assistant

## 2016-04-01 MED ORDER — AMOXICILLIN 500 MG PO CAPS: 500.0000 mg | ORAL_CAPSULE | Freq: Two times a day (BID) | ORAL | 0 refills | Status: AC

## 2016-04-01 NOTE — Progress Notes (Signed)
Pt called and informed that her strep culture was positive for beta hemolytic strep, not strep A. She is still having sore throat. Sent in a Rx for amoxillin 500mg  BID x 10 days. Informed to return to clinic if no improvement with antibiotics.

## 2016-04-01 NOTE — Telephone Encounter (Signed)
Pt calling about labs states that throat is no better still swollen couldn't hardly brush teeth please respond states that she's on her way to work and you can reach her at work [phone]

## 2016-06-10 ENCOUNTER — Telehealth: Payer: Self-pay | Admitting: Obstetrics and Gynecology

## 2016-06-10 ENCOUNTER — Other Ambulatory Visit: Payer: Self-pay

## 2016-06-10 NOTE — Telephone Encounter (Signed)
Left message to call and reschedule lab appointment.

## 2017-03-19 ENCOUNTER — Encounter: Payer: Self-pay | Admitting: Obstetrics and Gynecology

## 2017-03-19 ENCOUNTER — Ambulatory Visit (INDEPENDENT_AMBULATORY_CARE_PROVIDER_SITE_OTHER): Payer: 59 | Admitting: Obstetrics and Gynecology

## 2017-03-19 ENCOUNTER — Other Ambulatory Visit: Payer: Self-pay | Admitting: Obstetrics and Gynecology

## 2017-03-19 ENCOUNTER — Other Ambulatory Visit: Payer: Self-pay

## 2017-03-19 VITALS — BP 138/82 | HR 72 | Resp 16 | Ht 62.5 in | Wt 192.0 lb

## 2017-03-19 DIAGNOSIS — Z0000 Encounter for general adult medical examination without abnormal findings: Secondary | ICD-10-CM

## 2017-03-19 DIAGNOSIS — Z113 Encounter for screening for infections with a predominantly sexual mode of transmission: Secondary | ICD-10-CM

## 2017-03-19 DIAGNOSIS — Z01419 Encounter for gynecological examination (general) (routine) without abnormal findings: Secondary | ICD-10-CM | POA: Diagnosis not present

## 2017-03-19 DIAGNOSIS — E559 Vitamin D deficiency, unspecified: Secondary | ICD-10-CM

## 2017-03-19 DIAGNOSIS — Z1231 Encounter for screening mammogram for malignant neoplasm of breast: Secondary | ICD-10-CM

## 2017-03-19 NOTE — Patient Instructions (Signed)
I would recommend a mediterranean diet.  A mediterranean diet is high in fruits, vegetables, whole grains, fish, chicken, nuts, healthy fats (olive oil or canola oil). Low fat dairy. Limit butter, margarine, red meat and sweets.    EXERCISE AND DIET:  We recommended that you start or continue a regular exercise program for good health. Regular exercise means any activity that makes your heart beat faster and makes you sweat.  We recommend exercising at least 30 minutes per day at least 3 days a week, preferably 4 or 5.  We also recommend a diet low in fat and sugar.  Inactivity, poor dietary choices and obesity can cause diabetes, heart attack, stroke, and kidney damage, among others.    ALCOHOL AND SMOKING:  Women should limit their alcohol intake to no more than 7 drinks/beers/glasses of wine (combined, not each!) per week. Moderation of alcohol intake to this level decreases your risk of breast cancer and liver damage. And of course, no recreational drugs are part of a healthy lifestyle.  And absolutely no smoking or even second hand smoke. Most people know smoking can cause heart and lung diseases, but did you know it also contributes to weakening of your bones? Aging of your skin?  Yellowing of your teeth and nails?  CALCIUM AND VITAMIN D:  Adequate intake of calcium and Vitamin D are recommended.  The recommendations for exact amounts of these supplements seem to change often, but generally speaking 600 mg of calcium (either carbonate or citrate) and 800 units of Vitamin D per day seems prudent. Certain women may benefit from higher intake of Vitamin D.  If you are among these women, your doctor will have told you during your visit.    PAP SMEARS:  Pap smears, to check for cervical cancer or precancers,  have traditionally been done yearly, although recent scientific advances have shown that most women can have pap smears less often.  However, every woman still should have a physical exam from her  gynecologist every year. It will include a breast check, inspection of the vulva and vagina to check for abnormal growths or skin changes, a visual exam of the cervix, and then an exam to evaluate the size and shape of the uterus and ovaries.  And after 42 years of age, a rectal exam is indicated to check for rectal cancers. We will also provide age appropriate advice regarding health maintenance, like when you should have certain vaccines, screening for sexually transmitted diseases, bone density testing, colonoscopy, mammograms, etc.   MAMMOGRAMS:  All women over 40 years old should have a yearly mammogram. Many facilities now offer a "3D" mammogram, which may cost around $50 extra out of pocket. If possible,  we recommend you accept the option to have the 3D mammogram performed.  It both reduces the number of women who will be called back for extra views which then turn out to be normal, and it is better than the routine mammogram at detecting truly abnormal areas.    COLONOSCOPY:  Colonoscopy to screen for colon cancer is recommended for all women at age 50.  We know, you hate the idea of the prep.  We agree, BUT, having colon cancer and not knowing it is worse!!  Colon cancer so often starts as a polyp that can be seen and removed at colonscopy, which can quite literally save your life!  And if your first colonoscopy is normal and you have no family history of colon cancer, most women don't   have to have it again for 10 years.  Once every ten years, you can do something that may end up saving your life, right?  We will be happy to help you get it scheduled when you are ready.  Be sure to check your insurance coverage so you understand how much it will cost.  It may be covered as a preventative service at no cost, but you should check your particular policy.

## 2017-03-19 NOTE — Progress Notes (Signed)
41 y.o. [id number] Legally SeparatedAfrican AmericanF here for annual exam.  She had a DVT on OCP's. Had a mirena placed in 1/18 for contraception and menorrhagia with anemia. Cycles are so much better with the mirena. Sexually active, same partner for a year, things are going great.  Period Cycle (Days): 28 Period Duration (Days): 2-6 days  Period Pattern: Regular Menstrual Flow: Light Menstrual Control: Panty liner Dysmenorrhea: None  Patient's last menstrual period was 02/22/2017.          Sexually active: Yes.    The current method of family planning is IUD.    Exercising: Yes.    cardio/ weights Smoker:  Former smoker  Health Maintenance: Pap:  03-07-15 WNL NEG HR HPV  History of abnormal Pap:  no MMG:  Never Colonoscopy:  Never BMD:   Never TDaP:  03-07-15 Gardasil: No, discussed, information given (she will check on insurance)    reports that she has quit smoking. She has a 8.50 pack-year smoking history. She quit smokeless tobacco use about 4 years ago. She reports that she drinks about 1.2 oz of alcohol per week. She reports that she does not use drugs. 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 Youngest daughter is a sophomore at [employer], wants to be a PT or [employer], oldest is a Quarry manager and going to nursing school. Daughters are 80 and 9. Youngest daughter is still living at home.   Past Medical History:  Diagnosis Date  . Anxiety   . DVT (deep venous thrombosis) ([place]) 01/07/2016   right leg    Past Surgical History:  Procedure Laterality Date  . CESAREAN SECTION    . INTRAUTERINE DEVICE INSERTION  01/29/2016   Mirena  . TUBAL LIGATION      Current Outpatient Medications  Medication Sig Dispense Refill  . levonorgestrel (MIRENA) 20 MCG/24HR IUD 1 each by Intrauterine route once.     No current facility-administered medications for this visit.     Family History  Problem Relation Age of Onset  . Hypertension Mother     Review of Systems   Constitutional: Negative.   HENT: Negative.   Eyes: Negative.   Respiratory: Negative.   Cardiovascular: Negative.   Gastrointestinal: Negative.   Endocrine: Negative.   Genitourinary: Negative.   Musculoskeletal: Negative.   Skin: Negative.   Allergic/Immunologic: Negative.   Neurological: Negative.   Psychiatric/Behavioral: Negative.     Exam:   BP 138/82 (BP Location: Right Arm, Patient Position: Sitting, Cuff Size: Normal)   Pulse 72   Resp 16   Ht 5' 2.5" (1.588 m)   Wt 192 lb (87.1 kg)   LMP 02/22/2017   BMI 34.56 kg/m   Weight change: @WEIGHTCHANGE @ Height:   Height: 5' 2.5" (158.8 cm)  Ht Readings from Last 3 Encounters:  03/19/17 5' 2.5" (1.588 m)  03/26/16 5' 3.5" (1.613 m)  03/12/16 5' 2.5" (1.588 m)    General appearance: alert, cooperative and appears stated age Head: Normocephalic, without obvious abnormality, atraumatic Neck: no adenopathy, supple, symmetrical, trachea midline and thyroid normal to inspection and palpation Lungs: clear to auscultation bilaterally Cardiovascular: regular rate and rhythm Breasts: normal appearance, no masses or tenderness Abdomen: soft, non-tender; non distended,  no masses,  no organomegaly Extremities: extremities normal, atraumatic, no cyanosis or edema Skin: Skin color, texture, turgor normal. No rashes or lesions Lymph nodes: Cervical, supraclavicular, and axillary nodes normal. No abnormal inguinal nodes palpated Neurologic: Grossly normal   Pelvic: External  genitalia:  no lesions              Urethra:  normal appearing urethra with no masses, tenderness or lesions              Bartholins and Skenes: normal                 Vagina: normal appearing vagina with normal color and discharge, no lesions              Cervix: no lesions and IUD string 3 cm               Bimanual Exam:  Uterus:  normal size, contour, position, consistency, mobility, non-tender and anteverted              Adnexa: no mass, fullness,  tenderness               Rectovaginal: Confirms               Anus:  normal sphincter tone, no lesions  Chaperone was present for exam.  A:  Well Woman with normal exam  H/o vit d def  IUD check, doing well    P:   Pap next year  Screening STD  Screening labs  Discussed breast self exam  Discussed calcium and vit D intake  Mammogram overdue, she will schedule  Gardasil information given

## 2017-03-20 LAB — COMPREHENSIVE METABOLIC PANEL
A/G RATIO: 1.4 (ref 1.2–2.2)
ALT: 11 IU/L (ref 0–32)
AST: 14 IU/L (ref 0–40)
Albumin: 4.2 g/dL (ref 3.5–5.5)
Alkaline Phosphatase: 60 IU/L (ref 39–117)
BILIRUBIN TOTAL: 0.5 mg/dL (ref 0.0–1.2)
BUN/Creatinine Ratio: 8 — ABNORMAL LOW (ref 9–23)
BUN: 6 mg/dL (ref 6–24)
CHLORIDE: 102 mmol/L (ref 96–106)
CO2: 24 mmol/L (ref 20–29)
Calcium: 9.3 mg/dL (ref 8.7–10.2)
Creatinine, Ser: 0.71 mg/dL (ref 0.57–1.00)
GFR calc non Af Amer: 106 mL/min/{1.73_m2} (ref >59)
GFR, EST AFRICAN AMERICAN: 122 mL/min/{1.73_m2} (ref >59)
Globulin, Total: 2.9 g/dL (ref 1.5–4.5)
Glucose: 84 mg/dL (ref 65–99)
POTASSIUM: 4.1 mmol/L (ref 3.5–5.2)
Sodium: 141 mmol/L (ref 134–144)
Total Protein: 7.1 g/dL (ref 6.0–8.5)

## 2017-03-20 LAB — LIPID PANEL
Chol/HDL Ratio: 3.2 ratio (ref 0.0–4.4)
Cholesterol, Total: 144 mg/dL (ref 100–199)
HDL: 45 mg/dL (ref >39)
LDL Calculated: 80 mg/dL (ref 0–99)
Triglycerides: 94 mg/dL (ref 0–149)
VLDL Cholesterol Cal: 19 mg/dL (ref 5–40)

## 2017-03-20 LAB — HEPATITIS C ANTIBODY

## 2017-03-20 LAB — CBC
Hematocrit: 40.6 % (ref 34.0–46.6)
Hemoglobin: 13.2 g/dL (ref 11.1–15.9)
MCH: 28.7 pg (ref 26.6–33.0)
MCHC: 32.5 g/dL (ref 31.5–35.7)
MCV: 88 fL (ref 79–97)
PLATELETS: 276 10*3/uL (ref 150–379)
RBC: 4.6 x10E6/uL (ref 3.77–5.28)
RDW: 14.3 % (ref 12.3–15.4)
WBC: 6.8 10*3/uL (ref 3.4–10.8)

## 2017-03-20 LAB — GC/CHLAMYDIA PROBE AMP
Chlamydia trachomatis, NAA: NEGATIVE
Neisseria gonorrhoeae by PCR: NEGATIVE

## 2017-03-20 LAB — HEP, RPR, HIV PANEL
HEP B S AG: NEGATIVE
HIV SCREEN 4TH GENERATION: NONREACTIVE
RPR Ser Ql: NONREACTIVE

## 2017-03-20 LAB — VITAMIN D 25 HYDROXY (VIT D DEFICIENCY, FRACTURES): Vit D, 25-Hydroxy: 9.8 ng/mL — ABNORMAL LOW (ref 30.0–100.0)

## 2017-03-23 ENCOUNTER — Telehealth: Payer: Self-pay | Admitting: *Deleted

## 2017-03-23 DIAGNOSIS — R7989 Other specified abnormal findings of blood chemistry: Secondary | ICD-10-CM

## 2017-03-23 MED ORDER — VITAMIN D (ERGOCALCIFEROL) 1.25 MG (50000 UNIT) PO CAPS: 50000.0000 [IU] | ORAL_CAPSULE | ORAL | 0 refills | Status: DC

## 2017-03-23 NOTE — Telephone Encounter (Signed)
-----   Message from [name], MD sent at 03/21/2017  2:06 PM EST ----- Please inform the patient that her vit d level is very low and call in 50,000 IU of vit D3, take one tablet po qweek x 12 weeks. Then she needs a f/u vit d level. After that she will need to continue on vit d, but we will likely change the dose.  The rest of her lab work is normal

## 2017-03-23 NOTE — Telephone Encounter (Signed)
Call to patient. Results reviewed with patient and she verbalized understanding. Pharmacy on file confirmed. Vitamin D #12, 0RF sent to pharmacy on file. Instructions on use reviewed with patient and she verbalized understanding. Lab recheck scheduled for Tuesday 06/23/17 at [facility]. Patient agreeable to date and time of appointment. Future order placed for lab work.   Patient agreeable to disposition. Will close encounter.

## 2017-04-08 ENCOUNTER — Ambulatory Visit
Admission: RE | Admit: 2017-04-08 | Discharge: 2017-04-08 | Disposition: A | Discharge status: Home / Self Care | Payer: BLUE CROSS/BLUE SHIELD | Source: Ambulatory Visit | Attending: Obstetrics and Gynecology | Admitting: Obstetrics and Gynecology

## 2017-04-08 ENCOUNTER — Encounter: Payer: Self-pay | Admitting: Radiology

## 2017-04-08 DIAGNOSIS — Z1231 Encounter for screening mammogram for malignant neoplasm of breast: Secondary | ICD-10-CM

## 2017-06-23 ENCOUNTER — Other Ambulatory Visit (INDEPENDENT_AMBULATORY_CARE_PROVIDER_SITE_OTHER): Payer: 59

## 2017-06-23 DIAGNOSIS — R7989 Other specified abnormal findings of blood chemistry: Secondary | ICD-10-CM

## 2017-06-24 LAB — VITAMIN D 25 HYDROXY (VIT D DEFICIENCY, FRACTURES): VIT D 25 HYDROXY: 19.9 ng/mL — AB (ref 30.0–100.0)

## 2017-06-25 ENCOUNTER — Telehealth: Payer: Self-pay | Admitting: *Deleted

## 2017-06-25 MED ORDER — VITAMIN D (ERGOCALCIFEROL) 1.25 MG (50000 UNIT) PO CAPS: 50000.0000 [IU] | ORAL_CAPSULE | ORAL | 0 refills | Status: DC

## 2017-06-25 NOTE — Telephone Encounter (Signed)
Spoke with patient and gave results. RX for Vitamin D sent into [facility]. Patient scheduled for 12 week recheck -eh

## 2017-06-25 NOTE — Telephone Encounter (Signed)
-----   Message from [name], MD sent at 06/24/2017  5:30 PM EDT ----- Please let the patient know that her vit d has improved, but is still low. Please call in another 12 weeks of vit D 50,000 IU, 1 po q week x 12. Then she should have a f/u vit d level.

## 2017-09-17 ENCOUNTER — Other Ambulatory Visit (INDEPENDENT_AMBULATORY_CARE_PROVIDER_SITE_OTHER): Payer: 59

## 2017-09-17 ENCOUNTER — Other Ambulatory Visit: Payer: Self-pay | Admitting: Obstetrics and Gynecology

## 2017-09-17 DIAGNOSIS — E559 Vitamin D deficiency, unspecified: Secondary | ICD-10-CM

## 2017-09-17 NOTE — Telephone Encounter (Signed)
Medication refill request:Vitamin D Last AEX:  03/19/2017 Next AEX: 04/15/2018 Last MMG (if hormonal medication request):  Refill authorized: Vitamin D rechecked 09/17/2017, please advise.

## 2017-09-17 NOTE — Telephone Encounter (Signed)
Need to await vit d results from today to make a decision on vit D refill.

## 2017-09-18 LAB — VITAMIN D 25 HYDROXY (VIT D DEFICIENCY, FRACTURES): Vit D, 25-Hydroxy: 40.2 ng/mL (ref 30.0–100.0)

## 2017-12-21 ENCOUNTER — Telehealth: Payer: Self-pay | Admitting: Obstetrics and Gynecology

## 2017-12-21 ENCOUNTER — Encounter: Payer: Self-pay | Admitting: Obstetrics and Gynecology

## 2017-12-21 MED ORDER — CITALOPRAM HYDROBROMIDE 20 MG PO TABS: ORAL_TABLET | ORAL | 1 refills | Status: DC

## 2017-12-21 NOTE — Telephone Encounter (Signed)
Please let her know that I'm so sorry to hear of all of the stress she is under. I've called in Celexa for her, but particularly given her h/o DVT, I think she should be seen for her chest tightness. Even though it could be stress related, we don't want to miss a clot or a cardiac issue.  Please offer her names of therapists and schedule a f/u appointment here in one month.

## 2017-12-21 NOTE — Telephone Encounter (Signed)
Spoke with patient. Patient requesting medication for anxiety/depression. Patient reports "a lot going on in personal life",  evicted from home, grandmother with declining health in another state, and sister is suicidal. Patient reports she has been emotional the past 2-3 wks, gets "antsy and tightness in chest". Did not get out of bed at all on Saturday. Denies SI/HI.   IUD in place, reports regular, light menses with IUD, noticed "bean" size clot yesterday with start of menses. Denies fever/chills, SOB, pelvic pain, vaginal odor or d/c.   Patient states she has been on Celexa in the past, requesting to restart medication. Recommended patient f/u with PCP for further evaluation of chest tightness. Patient states she does not have a PCP, states she had the same symptoms last time she was started Celexa. Advised I will review with Dr. Talbert Nan and return call.   Dr. Talbert Nan -please review and advise.

## 2017-12-21 NOTE — Telephone Encounter (Signed)
Spoke with patient, advised as seen below per Dr. Nelson Chimes. 18mo f/u scheduled for 01/18/18 at 8:15am.  Patient states she is driving, request names of PCP providers and therapist be sent via MyChart message. Patient states she would like to review her insurance coverage to determine what providers are in network. Patient to return call on 11/26 to advise on PCP provider, is aware she can contact their office directly to schedule. ER precautions provided for new or worsening symptoms. Patient verbalizes understanding and is agreeable.   MyChart message to patient as requested.

## 2017-12-21 NOTE — Telephone Encounter (Signed)
Message   Good evening. I have been mentally stressed regarding work and family issues. I feel depressed and have anxiety often. Is there something that be prescribed to help me?

## 2017-12-30 NOTE — Telephone Encounter (Signed)
Call to patient to f/u. Number temporarily not in service.   Dr. Talbert Nan -patient is scheduled for for OV on 01/18/18, ok to close encounter?

## 2018-01-14 NOTE — Progress Notes (Signed)
GYNECOLOGY  VISIT   HPI: 42 y.o.   Legally Separated Black or African American Not Hispanic or Latino  female   [phone] with No LMP recorded. (Menstrual status: IUD).   here for 1 month follow up on Celexa.    The patient called last month requesting medication for anxiety and depression. She had previously been on Celexa and asked to restart it. She was given the names of counselors and Primary care MD.  Her grandmother got sick and died just prior to Thanksgiving. Her sister got hospitalized with depression, she got kicked out of her house (owner wanted to move back in). Her brother had surgery. Lots of stress in her life. She is working and going to school, had to decrease the # of classes she is taking.  She is doing better on the Celexa. Sister is doing better. She is a good living situation currently. Feels more like herself.   GYNECOLOGIC HISTORY: No LMP recorded. (Menstrual status: IUD). Contraception: IUD Menopausal hormone therapy: None        OB History    Gravida  4   Para  2   Term  2   Preterm      AB  2   Living  2     SAB  2   TAB      Ectopic      Multiple      Live Births  2              Patient Active Problem List   Diagnosis Date Noted  . Right leg DVT ([place]) 01/07/2016    Past Medical History:  Diagnosis Date  . Anxiety   . DVT (deep venous thrombosis) ([place]) 01/07/2016   right leg    Past Surgical History:  Procedure Laterality Date  . CESAREAN SECTION    . INTRAUTERINE DEVICE INSERTION  01/29/2016   Mirena  . TUBAL LIGATION      Current Outpatient Medications  Medication Sig Dispense Refill  . citalopram (CELEXA) 20 MG tablet Take 1/2 tablet a day po x 1 week. If tolerating, then increase to one tablet a day. 30 tablet 1  . levonorgestrel (MIRENA) 20 MCG/24HR IUD 1 each by Intrauterine route once.    . Vitamin D, Ergocalciferol, (DRISDOL) 50000 units CAPS capsule Take 1 capsule (50,000 Units total) by mouth every 7 (seven) days.  (Patient not taking: Reported on 01/18/2018) 12 capsule 0   No current facility-administered medications for this visit.      ALLERGIES: Patient has no known allergies.  Family History  Problem Relation Age of Onset  . Hypertension Mother   . Breast cancer Maternal Aunt        unsure of age  . Breast cancer Cousin        unsure of age    Social History   Socioeconomic History  . Marital status: Legally Separated    Spouse name: Not on file  . Number of children: Not on file  . Years of education: Not on file  . Highest education level: Not on file  Occupational History  . Not on file  Social Needs  . Financial resource strain: Not on file  . Food insecurity:    Worry: Not on file    Inability: Not on file  . Transportation needs:    Medical: Not on file    Non-medical: Not on file  Tobacco Use  . Smoking status: Former Smoker    Packs/day: 0.50  Years: 17.00    Pack years: 8.50  . Smokeless tobacco: Former Systems developer    Quit date: 11/27/2012  Substance and Sexual Activity  . Alcohol use: Yes    Alcohol/week: 2.0 standard drinks    Types: 1 Glasses of wine, 1 Shots of liquor per week    Comment: very occasional  . Drug use: No  . Sexual activity: Yes    Partners: Male    Birth control/protection: I.U.D.  Lifestyle  . Physical activity:    Days per week: Not on file    Minutes per session: Not on file  . Stress: Not on file  Relationships  . Social connections:    Talks on phone: Not on file    Gets together: Not on file    Attends religious service: Not on file    Active member of club or organization: Not on file    Attends meetings of clubs or organizations: Not on file    Relationship status: Not on file  . Intimate partner violence:    Fear of current or ex partner: Not on file    Emotionally abused: Not on file    Physically abused: Not on file    Forced sexual activity: Not on file  Other Topics Concern  . Not on file  Social History Narrative  .  Not on file    Review of Systems  Constitutional: Negative.   HENT: Negative.   Eyes: Negative.   Respiratory: Negative.   Cardiovascular: Negative.   Gastrointestinal: Negative.   Genitourinary: Negative.   Musculoskeletal: Negative.   Skin: Negative.   Neurological: Negative.   Endo/Heme/Allergies: Negative.   Psychiatric/Behavioral: Negative.     PHYSICAL EXAMINATION:    BP 132/84 (BP Location: Right Arm, Patient Position: Sitting, Cuff Size: Normal)   Pulse 72   Wt 178 lb 14.4 oz (81.1 kg)   BMI 32.20 kg/m     General appearance: alert, cooperative and appears stated age  ASSESSMENT Depression and anxiety, much better. Life situation has improved, Celexa is helping    PLAN Continue the Celexa F/U in 2/20 for annual exam Declined names of counselors.  Call with any concerns   An After Visit Summary was printed and given to the patient.  ~20 minutes face to face time of which over 50% was spent in counseling.

## 2018-01-18 ENCOUNTER — Other Ambulatory Visit: Payer: Self-pay

## 2018-01-18 ENCOUNTER — Encounter: Payer: Self-pay | Admitting: Obstetrics and Gynecology

## 2018-01-18 ENCOUNTER — Ambulatory Visit (INDEPENDENT_AMBULATORY_CARE_PROVIDER_SITE_OTHER): Payer: 59 | Admitting: Obstetrics and Gynecology

## 2018-01-18 VITALS — BP 132/84 | HR 72 | Wt 178.9 lb

## 2018-01-18 DIAGNOSIS — F418 Other specified anxiety disorders: Secondary | ICD-10-CM

## 2018-01-18 MED ORDER — CITALOPRAM HYDROBROMIDE 20 MG PO TABS: ORAL_TABLET | ORAL | 0 refills | Status: DC

## 2018-03-31 ENCOUNTER — Encounter: Payer: Self-pay | Admitting: Obstetrics and Gynecology

## 2018-03-31 ENCOUNTER — Telehealth: Payer: Self-pay | Admitting: Obstetrics and Gynecology

## 2018-03-31 NOTE — Telephone Encounter (Signed)
Patient sent the following correspondence through [facility]. Routing to triage to assist patient with request.  I went to see a therapist as requested. For the past month my depression has gotten worse. She suggested that on my next prescription that I up my dosage.   Thank you

## 2018-03-31 NOTE — Telephone Encounter (Signed)
Message left to return call to Triage Nurse at [phone].    

## 2018-04-01 NOTE — Telephone Encounter (Signed)
They don't make a 30 mg tablet. I would recommend that she increase her dose to 1.5 tablets a day of the 20 mg. If she needs more to get her to her next visit, please call in a one month supply.

## 2018-04-01 NOTE — Telephone Encounter (Signed)
Spoke with patient. Patient states she has been seeing a therapist as previously recommended for depression. Depression has gotten worse over the last month. Seen by therapist on 03/31/18, recommended increasing citalopram. Currently taking 20 mg po daily. Denies SI/HI. Denies any GYN symptoms. Has AEX scheduled for 04/15/18 with Dr. Talbert Nan. Confirmed pharmacy on file. Advised I will review with Dr. Nelson Chimes and return call, patient request detailed message on mobile number if no answer.   Dr. Talbert Nan -please advise on increasing citalopram Rx.

## 2018-04-02 NOTE — Telephone Encounter (Signed)
Spoke with patient, advised as seen below per Dr. Talbert Nan. Patient states she does not need a refill of citalopram at this time, has enough until AEX on 04/15/18. Patient verbalizes understanding and is agreeable.   Encounter closed.

## 2018-04-13 NOTE — Progress Notes (Signed)
43 y.o. [id number] Legally Separated Black or African American Not Hispanic or Latino female here for annual exam.   She is working from home with the Covid 19 Outbreak. She is finishing up school will graduate in 12/20. She has increased her celexa to 30 mg 1-2 weeks ago, feeling a little better. She is seeing a Social worker. Really missing her Grandmother.  Not currently in a relationship.   She has lost almost 20 lb in the last year. She has changed her diet. She is eating healthier. She is exercising.   She has a mirena IUD, placed in 1/18. Cycles are monthly but very light, only needs a panty liner. Cramps are better.    Period Cycle (Days): 28 Period Duration (Days): 4 days Period Pattern: Regular Menstrual Flow: Light Menstrual Control: Panty liner, Tampon Menstrual Control Change Freq (Hours): changes 3 times a day Dysmenorrhea: (!) Mild Dysmenorrhea Symptoms: Cramping  Patient's last menstrual period was 03/28/2018.          Sexually active: Yes.    The current method of family planning is IUD.    Exercising: Yes.    treadmill, weights, elliptical, yoga Smoker:  yes  Health Maintenance: Pap:  03-07-15 WNL NEG HR HPV  History of abnormal Pap:  no MMG:  04/08/2017 [place] 1 negative Colonoscopy:  Never BMD:   Never TDaP:  03-07-15 Gardasil: No, discussed, not covered by insurance.    reports that she has quit smoking. She has a 8.50 pack-year smoking history. She quit smokeless tobacco use about 5 years ago. She reports current alcohol use of about 2.0 standard drinks of alcohol per week. She reports that she does not use drugs. Daughters are 85 and 45. Both living at home and working.   Past Medical History:  Diagnosis Date  . Anxiety   . DVT (deep venous thrombosis) ([place]) 01/07/2016   right leg  DVT on OCP's  Past Surgical History:  Procedure Laterality Date  . CESAREAN SECTION    . INTRAUTERINE DEVICE INSERTION  01/29/2016   Mirena  . TUBAL LIGATION      Current  Outpatient Medications  Medication Sig Dispense Refill  . citalopram (CELEXA) 20 MG tablet Take 1/2 tablet a day po x 1 week. If tolerating, then increase to one tablet a day. (Patient taking differently: Take 1.5 daily.) 90 tablet 0  . levonorgestrel (MIRENA) 20 MCG/24HR IUD 1 each by Intrauterine route once.     No current facility-administered medications for this visit.     Family History  Problem Relation Age of Onset  . Hypertension Mother   . Breast cancer Maternal Aunt        unsure of age  . Breast cancer Cousin        unsure of age    Review of Systems  Constitutional: Negative.   HENT: Negative.   Eyes: Negative.   Respiratory: Negative.   Cardiovascular: Negative.   Gastrointestinal: Negative.   Endocrine: Negative.   Genitourinary: Negative.   Musculoskeletal: Negative.   Skin: Negative.   Allergic/Immunologic: Negative.   Neurological: Negative.   Psychiatric/Behavioral:       Depression Excessive crying    Exam:   BP 130/84 (BP Location: Right Arm, Patient Position: Sitting, Cuff Size: Normal)   Pulse 84   Ht 5' 3.78" (1.62 m)   Wt 175 lb 6.4 oz (79.6 kg)   LMP 03/28/2018   BMI 30.32 kg/m   Weight change: @WEIGHTCHANGE @ Height:   Height: 5' 3.78" (162 cm)  Ht Readings from Last 3 Encounters:  04/15/18 5' 3.78" (1.62 m)  03/19/17 5' 2.5" (1.588 m)  03/26/16 5' 3.5" (1.613 m)    General appearance: alert, cooperative and appears stated age Head: Normocephalic, without obvious abnormality, atraumatic Neck: no adenopathy, supple, symmetrical, trachea midline and thyroid normal to inspection and palpation Lungs: clear to auscultation bilaterally Cardiovascular: regular rate and rhythm Breasts: normal appearance, no masses or tenderness Abdomen: soft, non-tender; non distended,  no masses,  no organomegaly Extremities: extremities normal, atraumatic, no cyanosis or edema Skin: Skin color, texture, turgor normal. No rashes or lesions Lymph nodes:  Cervical, supraclavicular, and axillary nodes normal. No abnormal inguinal nodes palpated Neurologic: Grossly normal   Pelvic: External genitalia:  no lesions              Urethra:  normal appearing urethra with no masses, tenderness or lesions              Bartholins and Skenes: normal                 Vagina: normal appearing vagina with normal color and discharge, no lesions              Cervix: no lesions               Bimanual Exam:  Uterus:  normal size, contour, position, consistency, mobility, non-tender              Adnexa: no mass, fullness, tenderness               Rectovaginal: Confirms               Anus:  normal sphincter tone, no lesions  Chaperone was present for exam.  A:  Well Woman with normal exam  Depression/anxiety, recently increased her dose of celexa, would like to increase to 40 mg a day  P:   Pap with  Screening labs  Vit D  Increase celexa to 40 mg  Mammogram due  Discussed breast self exam  Discussed calcium and vit D intake

## 2018-04-15 ENCOUNTER — Ambulatory Visit (INDEPENDENT_AMBULATORY_CARE_PROVIDER_SITE_OTHER): Payer: 59 | Admitting: Obstetrics and Gynecology

## 2018-04-15 ENCOUNTER — Other Ambulatory Visit (HOSPITAL_COMMUNITY)
Admission: RE | Admit: 2018-04-15 | Discharge: 2018-04-15 | Disposition: A | Discharge status: Home / Self Care | Payer: 59 | Source: Ambulatory Visit | Attending: Obstetrics and Gynecology | Admitting: Obstetrics and Gynecology

## 2018-04-15 ENCOUNTER — Other Ambulatory Visit: Payer: Self-pay

## 2018-04-15 ENCOUNTER — Encounter: Payer: Self-pay | Admitting: Obstetrics and Gynecology

## 2018-04-15 VITALS — BP 130/84 | HR 84 | Ht 63.78 in | Wt 175.4 lb

## 2018-04-15 DIAGNOSIS — Z124 Encounter for screening for malignant neoplasm of cervix: Secondary | ICD-10-CM | POA: Insufficient documentation

## 2018-04-15 DIAGNOSIS — E559 Vitamin D deficiency, unspecified: Secondary | ICD-10-CM

## 2018-04-15 DIAGNOSIS — Z30431 Encounter for routine checking of intrauterine contraceptive device: Secondary | ICD-10-CM

## 2018-04-15 DIAGNOSIS — Z0000 Encounter for general adult medical examination without abnormal findings: Secondary | ICD-10-CM

## 2018-04-15 DIAGNOSIS — Z01419 Encounter for gynecological examination (general) (routine) without abnormal findings: Secondary | ICD-10-CM

## 2018-04-15 DIAGNOSIS — Z8659 Personal history of other mental and behavioral disorders: Secondary | ICD-10-CM

## 2018-04-15 MED ORDER — CITALOPRAM HYDROBROMIDE 40 MG PO TABS: 40.0000 mg | ORAL_TABLET | Freq: Every day | ORAL | 3 refills | Status: DC

## 2018-04-15 NOTE — Patient Instructions (Signed)
EXERCISE AND DIET:  We recommended that you start or continue a regular exercise program for good health. Regular exercise means any activity that makes your heart beat faster and makes you sweat.  We recommend exercising at least 30 minutes per day at least 3 days a week, preferably 4 or 5.  We also recommend a diet low in fat and sugar.  Inactivity, poor dietary choices and obesity can cause diabetes, heart attack, stroke, and kidney damage, among others.    ALCOHOL AND SMOKING:  Women should limit their alcohol intake to no more than 7 drinks/beers/glasses of wine (combined, not each!) per week. Moderation of alcohol intake to this level decreases your risk of breast cancer and liver damage. And of course, no recreational drugs are part of a healthy lifestyle.  And absolutely no smoking or even second hand smoke. Most people know smoking can cause heart and lung diseases, but did you know it also contributes to weakening of your bones? Aging of your skin?  Yellowing of your teeth and nails?  CALCIUM AND VITAMIN D:  Adequate intake of calcium and Vitamin D are recommended.  The recommendations for exact amounts of these supplements seem to change often, but generally speaking 1,000 mg of calcium (between diet and supplement) and 800 units of Vitamin D per day seems prudent. Certain women may benefit from higher intake of Vitamin D.  If you are among these women, your doctor will have told you during your visit.    PAP SMEARS:  Pap smears, to check for cervical cancer or precancers,  have traditionally been done yearly, although recent scientific advances have shown that most women can have pap smears less often.  However, every woman still should have a physical exam from her gynecologist every year. It will include a breast check, inspection of the vulva and vagina to check for abnormal growths or skin changes, a visual exam of the cervix, and then an exam to evaluate the size and shape of the uterus and  ovaries.  And after 43 years of age, a rectal exam is indicated to check for rectal cancers. We will also provide age appropriate advice regarding health maintenance, like when you should have certain vaccines, screening for sexually transmitted diseases, bone density testing, colonoscopy, mammograms, etc.   MAMMOGRAMS:  All women over 40 years old should have a yearly mammogram. Many facilities now offer a "3D" mammogram, which may cost around $50 extra out of pocket. If possible,  we recommend you accept the option to have the 3D mammogram performed.  It both reduces the number of women who will be called back for extra views which then turn out to be normal, and it is better than the routine mammogram at detecting truly abnormal areas.    COLON CANCER SCREENING: Now recommend starting at age 45. At this time colonoscopy is not covered for routine screening until 50. There are take home tests that can be done between 45-49.   COLONOSCOPY:  Colonoscopy to screen for colon cancer is recommended for all women at age 50.  We know, you hate the idea of the prep.  We agree, BUT, having colon cancer and not knowing it is worse!!  Colon cancer so often starts as a polyp that can be seen and removed at colonscopy, which can quite literally save your life!  And if your first colonoscopy is normal and you have no family history of colon cancer, most women don't have to have it again for   10 years.  Once every ten years, you can do something that may end up saving your life, right?  We will be happy to help you get it scheduled when you are ready.  Be sure to check your insurance coverage so you understand how much it will cost.  It may be covered as a preventative service at no cost, but you should check your particular policy.      Breast Self-Awareness Breast self-awareness means being familiar with how your breasts look and feel. It involves checking your breasts regularly and reporting any changes to your  health care provider. Practicing breast self-awareness is important. A change in your breasts can be a sign of a serious medical problem. Being familiar with how your breasts look and feel allows you to find any problems early, when treatment is more likely to be successful. All women should practice breast self-awareness, including women who have had breast implants. How to do a breast self-exam One way to learn what is normal for your breasts and whether your breasts are changing is to do a breast self-exam. To do a breast self-exam: Look for Changes  1. Remove all the clothing above your waist. 2. Stand in front of a mirror in a room with good lighting. 3. Put your hands on your hips. 4. Push your hands firmly downward. 5. Compare your breasts in the mirror. Look for differences between them (asymmetry), such as: ? Differences in shape. ? Differences in size. ? Puckers, dips, and bumps in one breast and not the other. 6. Look at each breast for changes in your skin, such as: ? Redness. ? Scaly areas. 7. Look for changes in your nipples, such as: ? Discharge. ? Bleeding. ? Dimpling. ? Redness. ? A change in position. Feel for Changes Carefully feel your breasts for lumps and changes. It is best to do this while lying on your back on the floor and again while sitting or standing in the shower or tub with soapy water on your skin. Feel each breast in the following way:  Place the arm on the side of the breast you are examining above your head.  Feel your breast with the other hand.  Start in the nipple area and make  inch (2 cm) overlapping circles to feel your breast. Use the pads of your three middle fingers to do this. Apply light pressure, then medium pressure, then firm pressure. The light pressure will allow you to feel the tissue closest to the skin. The medium pressure will allow you to feel the tissue that is a little deeper. The firm pressure will allow you to feel the tissue  close to the ribs.  Continue the overlapping circles, moving downward over the breast until you feel your ribs below your breast.  Move one finger-width toward the center of the body. Continue to use the  inch (2 cm) overlapping circles to feel your breast as you move slowly up toward your collarbone.  Continue the up and down exam using all three pressures until you reach your armpit.  Write Down What You Find  Write down what is normal for each breast and any changes that you find. Keep a written record with breast changes or normal findings for each breast. By writing this information down, you do not need to depend only on memory for size, tenderness, or location. Write down where you are in your menstrual cycle, if you are still menstruating. If you are having trouble noticing differences   in your breasts, do not get discouraged. With time you will become more familiar with the variations in your breasts and more comfortable with the exam. How often should I examine my breasts? Examine your breasts every month. If you are breastfeeding, the best time to examine your breasts is after a feeding or after using a breast pump. If you menstruate, the best time to examine your breasts is 5-7 days after your period is over. During your period, your breasts are lumpier, and it may be more difficult to notice changes. When should I see my health care provider? See your health care provider if you notice:  A change in shape or size of your breasts or nipples.  A change in the skin of your breast or nipples, such as a reddened or scaly area.  Unusual discharge from your nipples.  A lump or thick area that was not there before.  Pain in your breasts.  Anything that concerns you.  

## 2018-04-16 LAB — CYTOLOGY - PAP
DIAGNOSIS: NEGATIVE
HPV (WINDOPATH): DETECTED — AB
HPV 16/18/45 GENOTYPING: NEGATIVE

## 2018-04-16 LAB — COMPREHENSIVE METABOLIC PANEL
ALK PHOS: 62 IU/L (ref 39–117)
ALT: 11 IU/L (ref 0–32)
AST: 16 IU/L (ref 0–40)
Albumin/Globulin Ratio: 1.5 (ref 1.2–2.2)
Albumin: 4 g/dL (ref 3.8–4.8)
BILIRUBIN TOTAL: 0.5 mg/dL (ref 0.0–1.2)
BUN / CREAT RATIO: 14 (ref 9–23)
BUN: 11 mg/dL (ref 6–24)
CO2: 26 mmol/L (ref 20–29)
CREATININE: 0.76 mg/dL (ref 0.57–1.00)
Calcium: 9.2 mg/dL (ref 8.7–10.2)
Chloride: 104 mmol/L (ref 96–106)
GFR calc Af Amer: 112 mL/min/{1.73_m2} (ref >59)
GFR calc non Af Amer: 97 mL/min/{1.73_m2} (ref >59)
GLUCOSE: 88 mg/dL (ref 65–99)
Globulin, Total: 2.7 g/dL (ref 1.5–4.5)
Potassium: 4.5 mmol/L (ref 3.5–5.2)
Sodium: 142 mmol/L (ref 134–144)
Total Protein: 6.7 g/dL (ref 6.0–8.5)

## 2018-04-16 LAB — LIPID PANEL
CHOLESTEROL TOTAL: 159 mg/dL (ref 100–199)
Chol/HDL Ratio: 3.8 ratio (ref 0.0–4.4)
HDL: 42 mg/dL (ref >39)
LDL CALC: 98 mg/dL (ref 0–99)
TRIGLYCERIDES: 93 mg/dL (ref 0–149)
VLDL CHOLESTEROL CAL: 19 mg/dL (ref 5–40)

## 2018-04-16 LAB — CBC
HEMOGLOBIN: 13.2 g/dL (ref 11.1–15.9)
Hematocrit: 42.1 % (ref 34.0–46.6)
MCH: 28.6 pg (ref 26.6–33.0)
MCHC: 31.4 g/dL — AB (ref 31.5–35.7)
MCV: 91 fL (ref 79–97)
PLATELETS: 247 10*3/uL (ref 150–450)
RBC: 4.62 x10E6/uL (ref 3.77–5.28)
RDW: 13.3 % (ref 11.7–15.4)
WBC: 5.9 10*3/uL (ref 3.4–10.8)

## 2018-04-16 LAB — VITAMIN D 25 HYDROXY (VIT D DEFICIENCY, FRACTURES): Vit D, 25-Hydroxy: 12 ng/mL — ABNORMAL LOW (ref 30.0–100.0)

## 2018-04-21 ENCOUNTER — Other Ambulatory Visit: Payer: Self-pay

## 2018-04-21 MED ORDER — VITAMIN D (ERGOCALCIFEROL) 1.25 MG (50000 UNIT) PO CAPS: 50000.0000 [IU] | ORAL_CAPSULE | ORAL | 0 refills | Status: DC

## 2018-05-10 ENCOUNTER — Encounter: Payer: Self-pay | Admitting: Obstetrics and Gynecology

## 2018-05-11 ENCOUNTER — Telehealth: Payer: Self-pay | Admitting: Obstetrics and Gynecology

## 2018-05-11 NOTE — Telephone Encounter (Signed)
Message   I have a question about COMPREHENSIVE METABOLIC PANEL resulted on 04/16/18, 6:35 AM.    What do all these testing means?

## 2018-06-07 ENCOUNTER — Telehealth: Payer: Self-pay

## 2018-06-07 NOTE — Telephone Encounter (Signed)
Spoke with patient. Advised of message as seen below from Dr.Jertson. Patient verbalizes understanding. Encounter closed. 

## 2018-06-07 NOTE — Telephone Encounter (Signed)
Just have her go down to one 5,000 IU vit D3 a day until she can get her f/u lab work.

## 2018-06-07 NOTE — Telephone Encounter (Signed)
  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 I wanted to let you know I spoke with the MD this morning and she recommends that you take 2,000 IU daily of the Vitamin D until you can be seen for the recheck. So you can either take 2 of the 1,000 IU daily or purchase Vitamin D 2,000 IU over the counter as well.   Thank you,   Reesa Chew, RN      Dr.Jertson, patient has been taking 5,000 IU of Vitamin D 2 times daily after stopping her Vitamin D 50,000 IU weekly as her Vitamin D recheck was cancelled due to COVID 19. Is this okay or does she need to reduce her intake?

## 2018-06-23 ENCOUNTER — Telehealth: Payer: Self-pay | Admitting: Obstetrics and Gynecology

## 2018-06-23 ENCOUNTER — Encounter: Payer: Self-pay | Admitting: Obstetrics and Gynecology

## 2018-06-23 DIAGNOSIS — E559 Vitamin D deficiency, unspecified: Secondary | ICD-10-CM

## 2018-06-23 NOTE — Telephone Encounter (Signed)
Patient sent the following correspondence through [place]. Routing to triage to assist patient with request.  I got this message from the pharmacy today.... ...,[facility]: [place], your Dr Lu Duffel not respond to our request for additional refills for Rx VI. Please reach out to your Dr and request a new Rx.

## 2018-06-23 NOTE — Telephone Encounter (Signed)
Spoke with patient. Advised message received from pharmacy may be for an automated requested for Vitamin D, recommended patient f/u with pharmacy to confirm. Patient has been on 5,000 IU OTC Vit D3 daily, is due for Vit D recheck. Lab appointment scheduled for 6/15 at 9:45am. Advised Dr. Talbert Nan will make recommendations after lab results are back. Patient verbalizes understanding.   04/15/18 Vit D 12.0 Future lab order placed.   Routing to provider for final review. Patient is agreeable to disposition. Will close encounter.

## 2018-06-28 ENCOUNTER — Other Ambulatory Visit: Payer: Self-pay

## 2018-07-06 ENCOUNTER — Other Ambulatory Visit: Payer: Self-pay | Admitting: Obstetrics and Gynecology

## 2018-07-12 ENCOUNTER — Other Ambulatory Visit: Payer: Self-pay

## 2018-07-12 ENCOUNTER — Other Ambulatory Visit (INDEPENDENT_AMBULATORY_CARE_PROVIDER_SITE_OTHER): Payer: 59

## 2018-07-12 DIAGNOSIS — E559 Vitamin D deficiency, unspecified: Secondary | ICD-10-CM

## 2018-07-14 ENCOUNTER — Encounter: Payer: Self-pay | Admitting: Obstetrics and Gynecology

## 2018-07-14 LAB — VITAMIN D 25 HYDROXY (VIT D DEFICIENCY, FRACTURES): Vit D, 25-Hydroxy: 64.4 ng/mL (ref 30.0–100.0)

## 2018-07-15 ENCOUNTER — Telehealth: Payer: Self-pay | Admitting: Obstetrics and Gynecology

## 2018-07-15 NOTE — Telephone Encounter (Signed)
Spoke with patient and advised patient, per MyChart message, she still needs to take OTC Vit D 1000 IUD daily--long term. Patient thanked me for calling.

## 2018-07-15 NOTE — Telephone Encounter (Signed)
Patient sent the following correspondence through [place]. Routing to triage to assist patient with request.  Good morning. I see that my results are good now. Just continue to take otc vit D pills

## 2018-10-27 ENCOUNTER — Telehealth: Payer: Self-pay | Admitting: Obstetrics and Gynecology

## 2018-10-27 ENCOUNTER — Encounter: Payer: Self-pay | Admitting: Obstetrics and Gynecology

## 2018-10-27 ENCOUNTER — Other Ambulatory Visit: Payer: Self-pay

## 2018-10-27 NOTE — Telephone Encounter (Signed)
Call to patient. Patient states that she has started having hot flashes, mostly at night. Noticed them right before her 43rd birthday on [birth year]. Denies other vasomotor symptoms. Has mirena IUD. OV offered. Patient scheduled for 10-28-2018 at 1300 with Dr. Talbert Nan. Patient agreeable to date and time of appointment.   Routing to provider and will close encounter.

## 2018-10-27 NOTE — Telephone Encounter (Signed)
Good morning. I was wondering if it is normal for me to be having hot flashes?

## 2018-10-28 NOTE — Progress Notes (Signed)
GYNECOLOGY  VISIT   HPI: 43 y.o.   Legally Separated Black or African American Not Hispanic or Latino  female   [id number] with No LMP recorded. (Menstrual status: IUD).   here for hot flashes and night sweats daily.   She has a mirena IUD, placed in 1/18. She has monthly, light cycles x 4-5 days. She is fine during the day. In the evening she starts having hot flashes, has them a couple of times prior to going to bed. A couple of nights a week she wakes up with hot flashes or night sweats. She can go back to sleep after she wakes up.  Not currently sexually active. She has noticed a deepening of her voice in the last few months. No hirsutism, no acne, no hair loss.     GYNECOLOGIC HISTORY: No LMP recorded. (Menstrual status: IUD). Contraception: IUD Menopausal hormone therapy: None        OB History    Gravida  4   Para  2   Term  2   Preterm      AB  2   Living  2     SAB  2   TAB      Ectopic      Multiple      Live Births  2              Patient Active Problem List   Diagnosis Date Noted  . 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   Past Medical History:  Diagnosis Date  . Anxiety   . DVT (deep venous thrombosis) ([place]) 01/07/2016   right leg    Past Surgical History:  Procedure Laterality Date  . CESAREAN SECTION    . INTRAUTERINE DEVICE INSERTION  01/29/2016   Mirena  . TUBAL LIGATION      Current Outpatient Medications  Medication Sig Dispense Refill  . citalopram (CELEXA) 40 MG tablet Take 1 tablet (40 mg total) by mouth daily. 90 tablet 3  . levonorgestrel (MIRENA) 20 MCG/24HR IUD 1 each by Intrauterine route once.     No current facility-administered medications for this visit.      ALLERGIES: Patient has no known allergies.  Family History  Problem Relation Age of Onset  . Hypertension Mother   . Breast cancer Maternal Aunt        unsure of age  . Breast cancer Cousin        unsure of age    Social History    Socioeconomic History  . Marital status: Legally Separated    Spouse name: Not on file  . Number of children: Not on file  . Years of education: Not on file  . Highest education level: Not on file  Occupational History  . Not on file  Social Needs  . Financial resource strain: Not on file  . Food insecurity    Worry: Not on file    Inability: Not on file  . Transportation needs    Medical: Not on file    Non-medical: Not on file  Tobacco Use  . Smoking status: Former Smoker    Packs/day: 0.50    Years: 17.00    Pack years: 8.50  . Smokeless tobacco: Former Systems developer    Quit date: 11/27/2012  Substance and Sexual Activity  . Alcohol use: Yes    Comment: very occasional  . Drug use: No  . Sexual activity: Not Currently    Partners: Male  Birth control/protection: I.U.D.  Lifestyle  . Physical activity    Days per week: Not on file    Minutes per session: Not on file  . Stress: Not on file  Relationships  . Social Herbalist on phone: Not on file    Gets together: Not on file    Attends religious service: Not on file    Active member of club or organization: Not on file    Attends meetings of clubs or organizations: Not on file    Relationship status: Not on file  . Intimate partner violence    Fear of current or ex partner: Not on file    Emotionally abused: Not on file    Physically abused: Not on file    Forced sexual activity: Not on file  Other Topics Concern  . Not on file  Social History Narrative  . Not on file    Review of Systems  Constitutional:       Hot flashes Night sweats  HENT: Negative.   Eyes: Negative.   Respiratory: Negative.   Cardiovascular: Negative.   Gastrointestinal: Negative.   Genitourinary: Negative.   Musculoskeletal: Negative.   Skin: Negative.   Neurological: Negative.   Endo/Heme/Allergies: Negative.   Psychiatric/Behavioral:       Sleep disturbance    PHYSICAL EXAMINATION:    BP 138/88 (BP Location:  Right Arm, Patient Position: Sitting, Cuff Size: Large)   Pulse 64   Temp (!) 97.1 F (36.2 C) (Skin)   Wt 195 lb 3.2 oz (88.5 kg)   BMI 33.74 kg/m     General appearance: alert, cooperative and appears stated age  ASSESSMENT New onset vasomotor symptoms, currently tolerable. She has a h/o DVT so isn't a candidate for HRT. She is already on Celexa which should help with vasomotor symptoms Deepening of her voice, no other signs of androgen excess    PLAN Discussed avoiding triggers, discussed over the counter agents to help with vasomotor symptoms If her symptoms worsen will try gabapentin Will check [place] and testosterone levels Referral to ENT   An After Visit Summary was printed and given to the patient.  ~15 minutes face to face time of which over 50% was spent in counseling.

## 2018-10-29 ENCOUNTER — Ambulatory Visit (INDEPENDENT_AMBULATORY_CARE_PROVIDER_SITE_OTHER): Payer: 59 | Admitting: Obstetrics and Gynecology

## 2018-10-29 ENCOUNTER — Other Ambulatory Visit: Payer: Self-pay

## 2018-10-29 ENCOUNTER — Encounter: Payer: Self-pay | Admitting: Obstetrics and Gynecology

## 2018-10-29 VITALS — BP 138/88 | HR 64 | Temp 97.1°F | Wt 195.2 lb

## 2018-10-29 DIAGNOSIS — R61 Generalized hyperhidrosis: Secondary | ICD-10-CM

## 2018-10-29 DIAGNOSIS — R232 Flushing: Secondary | ICD-10-CM

## 2018-10-29 DIAGNOSIS — R499 Unspecified voice and resonance disorder: Secondary | ICD-10-CM | POA: Diagnosis not present

## 2018-10-29 NOTE — Patient Instructions (Addendum)
You can try [place] during the day or [place] pm at night.    Menopause Menopause is the normal time of life when menstrual periods stop completely. It is usually confirmed by 12 months without a menstrual period. The transition to menopause (perimenopause) most often happens between the ages of 31 and 43. During perimenopause, hormone levels change in your body, which can cause symptoms and affect your health. Menopause may increase your risk for:  Loss of bone (osteoporosis), which causes bone breaks (fractures).  Depression.  Hardening and narrowing of the arteries (atherosclerosis), which can cause heart attacks and strokes. What are the causes? This condition is usually caused by a natural change in hormone levels that happens as you get older. The condition may also be caused by surgery to remove both ovaries (bilateral oophorectomy). What increases the risk? This condition is more likely to start at an earlier age if you have certain medical conditions or treatments, including:  A tumor of the pituitary gland in the brain.  A disease that affects the ovaries and hormone production.  Radiation treatment for cancer.  Certain cancer treatments, such as chemotherapy or hormone (anti-estrogen) therapy.  Heavy smoking and excessive alcohol use.  Family history of early menopause. This condition is also more likely to develop earlier in women who are very thin. What are the signs or symptoms? Symptoms of this condition include:  Hot flashes.  Irregular menstrual periods.  Night sweats.  Changes in feelings about sex. This could be a decrease in sex drive or an increased comfort around your sexuality.  Vaginal dryness and thinning of the vaginal walls. This may cause painful intercourse.  Dryness of the skin and development of wrinkles.  Headaches.  Problems sleeping (insomnia).  Mood swings or irritability.  Memory problems.  Weight gain.  Hair growth on the  face and chest.  Bladder infections or problems with urinating. How is this diagnosed? This condition is diagnosed based on your medical history, a physical exam, your age, your menstrual history, and your symptoms. Hormone tests may also be done. How is this treated? In some cases, no treatment is needed. You and your health care provider should make a decision together about whether treatment is necessary. Treatment will be based on your individual condition and preferences. Treatment for this condition focuses on managing symptoms. Treatment may include:  Menopausal hormone therapy (MHT).  Medicines to treat specific symptoms or complications.  Acupuncture.  Vitamin or herbal supplements. Before starting treatment, make sure to let your health care provider know if you have a personal or family history of:  Heart disease.  Breast cancer.  Blood clots.  Diabetes.  Osteoporosis. Follow these instructions at home: Lifestyle  Do not use any products that contain nicotine or tobacco, such as cigarettes and e-cigarettes. If you need help quitting, ask your health care provider.  Get at least 30 minutes of physical activity on 5 or more days each week.  Avoid alcoholic and caffeinated beverages, as well as spicy foods. This may help prevent hot flashes.  Get 7-8 hours of sleep each night.  If you have hot flashes, try: ? Dressing in layers. ? Avoiding things that may trigger hot flashes, such as spicy food, warm places, or stress. ? Taking slow, deep breaths when a hot flash starts. ? Keeping a fan in your home and office.  Find ways to manage stress, such as deep breathing, meditation, or journaling.  Consider going to group therapy with other women who are  having menopause symptoms. Ask your health care provider about recommended group therapy meetings. Eating and drinking  Eat a healthy, balanced diet that contains whole grains, lean protein, low-fat dairy, and plenty  of fruits and vegetables.  Your health care provider may recommend adding more soy to your diet. Foods that contain soy include tofu, tempeh, and soy milk.  Eat plenty of foods that contain calcium and vitamin D for bone health. Items that are rich in calcium include low-fat milk, yogurt, beans, almonds, sardines, broccoli, and kale. Medicines  Take over-the-counter and prescription medicines only as told by your health care provider.  Talk with your health care provider before starting any herbal supplements. If prescribed, take vitamins and supplements as told by your health care provider. These may include: ? Calcium. Women age 16 and older should get 1,200 mg (milligrams) of calcium every day. ? Vitamin D. Women need 600-800 International Units of vitamin D each day. ? Vitamins B12 and B6. Aim for 50 micrograms of B12 and 1.5 mg of B6 each day. General instructions  Keep track of your menstrual periods, including: ? When they occur. ? How heavy they are and how long they last. ? How much time passes between periods.  Keep track of your symptoms, noting when they start, how often you have them, and how long they last.  Use vaginal lubricants or moisturizers to help with vaginal dryness and improve comfort during sex.  Keep all follow-up visits as told by your health care provider. This is important. This includes any group therapy or counseling. Contact a health care provider if:  You are still having menstrual periods after age 56.  You have pain during sex.  You have not had a period for 12 months and you develop vaginal bleeding. Get help right away if:  You have: ? Severe depression. ? Excessive vaginal bleeding. ? Pain when you urinate. ? A fast or irregular heart beat (palpitations). ? Severe headaches. ? Abdomen (abdominal) pain or severe indigestion.  You fell and you think you have a broken bone.  You develop leg or chest pain.  You develop vision  problems.  You feel a lump in your breast. Summary  Menopause is the normal time of life when menstrual periods stop completely. It is usually confirmed by 12 months without a menstrual period.  The transition to menopause (perimenopause) most often happens between the ages of 83 and 7.  Symptoms can be managed through medicines, lifestyle changes, and complementary therapies such as acupuncture.  Eat a balanced diet that is rich in nutrients to promote bone health and heart health and to manage symptoms during menopause. This information is not intended to replace advice given to you by your health care provider. Make sure you discuss any questions you have with your health care provider. Document Released: 04/05/2003 Document Revised: 12/26/2016 Document Reviewed: 02/16/2016 Elsevier Patient Education  2020 [employer].

## 2018-11-03 ENCOUNTER — Encounter: Payer: Self-pay | Admitting: Obstetrics and Gynecology

## 2018-11-04 ENCOUNTER — Telehealth: Payer: Self-pay | Admitting: Obstetrics and Gynecology

## 2018-11-04 LAB — TESTT+TESTF+SHBG
Sex Hormone Binding: 26.3 nmol/L (ref 24.6–122.0)
Testosterone, Free: 3 pg/mL (ref 0.0–4.2)
Testosterone, Total, LC/MS: 20.7 ng/dL

## 2018-11-04 LAB — FOLLICLE STIMULATING HORMONE: FSH: 13.1 m[IU]/mL

## 2018-11-04 NOTE — Telephone Encounter (Signed)
Dr. Talbert Nan -please review 10/29/18 labs and advise.

## 2018-11-04 NOTE — Telephone Encounter (Signed)
Patient sent the following correspondence through [place].    Good evening. I was wondering if my test results came in.

## 2018-12-06 NOTE — Telephone Encounter (Signed)
OK to close or is further follow up necessary? °

## 2019-01-05 ENCOUNTER — Telehealth: Payer: Self-pay | Admitting: Obstetrics and Gynecology

## 2019-01-05 NOTE — Telephone Encounter (Signed)
Call placed to follow up on referral. 

## 2019-01-05 NOTE — Telephone Encounter (Signed)
Patient returning call to [facility] regarding referral. States she does not remember having a referral. Tried to return call to [facility], but received message saying the telephone was incorrect.

## 2019-01-07 NOTE — Telephone Encounter (Signed)
Spoke with the patient and she has declined the referral. Patient states she is better now and does not feel it is needed. Okay per Dr. Talbert Nan to close the referral.

## 2019-04-20 ENCOUNTER — Other Ambulatory Visit: Payer: Self-pay | Admitting: Obstetrics and Gynecology

## 2019-04-20 NOTE — Telephone Encounter (Signed)
Medication refill request: citalopram  Last AEX:  04-15-2018 JJ  Next AEX: 04-27-19 Last MMG (if hormonal medication request): n/a Refill authorized: Today, please advise.   Medication pended for #30, 0RF. Please refill if appropriate.

## 2019-04-25 NOTE — Progress Notes (Signed)
44 y.o. [id number] Legally Separated Black or African American Not Hispanic or Latino female here for annual exam.   She is sexually active, same partner x 3 months. No dyspareunia.  H/O DVT on OCP's.  Has a mirena IUD, placed in 1/18. Monthly light cycles x 2-3 days.  In the fall she was seen with new onset vasomotor symptoms and deepening of the voice. Normal testosterone panel FSH of 13. She was referred to ENT, symptoms resolved and she didn't go to ENT. Vasomotor symptoms are tolerable. Sleeping okay.   She is having mid back pain from her large breasts. Considering breast reduction.     No LMP recorded. (Menstrual status: IUD).          Sexually active: Yes.    The current method of family planning is IUD.    Exercising: No.  The patient does not participate in regular exercise at present. Smoker:  no  Health Maintenance: Pap: 04/15/18 Normal HR HPV +, 03-07-15 WNL NEG HR HPV History of abnormal Pap:  yes MMG:  04/08/17  Density B Bi-rads 1 neg  TDaP:  03/07/15  Gardasil: no   reports that she has quit smoking. She has a 8.50 pack-year smoking history. She quit smokeless tobacco use about 6 years ago. She reports current alcohol use. She reports that she does not use drugs. No ETOH. 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 Will graduate with a degree in social work in 5/22.  Daughters are 35 and 57. The youngest is still at home for a few more months.   Past Medical History:  Diagnosis Date  . Anxiety   . DVT (deep venous thrombosis) ([place]) 01/07/2016   right leg    Past Surgical History:  Procedure Laterality Date  . CESAREAN SECTION    . INTRAUTERINE DEVICE INSERTION  01/29/2016   Mirena  . TUBAL LIGATION      Current Outpatient Medications  Medication Sig Dispense Refill  . citalopram (CELEXA) 40 MG tablet TAKE 1 TABLET BY MOUTH EVERY DAY 30 tablet 0  . levonorgestrel (MIRENA) 20 MCG/24HR IUD 1 each by Intrauterine route once.     No current  facility-administered medications for this visit.    Family History  Problem Relation Age of Onset  . Hypertension Mother   . Breast cancer Maternal Aunt        unsure of age  . Breast cancer Cousin        unsure of age    Review of Systems  Constitutional: Negative.   HENT: Negative.   Respiratory: Negative.   Cardiovascular: Negative.   Gastrointestinal: Negative.   Genitourinary: Negative.   Musculoskeletal: Negative.   Skin: Negative.   Neurological: Negative.   Hematological: Negative.   Psychiatric/Behavioral: Negative.     Exam:   BP 134/64   Pulse 88   Temp 97.9 F (36.6 C)   Ht 5' 2.75" (1.594 m)   Wt 192 lb (87.1 kg)   SpO2 98%   BMI 34.28 kg/m   Weight change: @WEIGHTCHANGE @ Height:   Height: 5' 2.75" (159.4 cm)  Ht Readings from Last 3 Encounters:  04/27/19 5' 2.75" (1.594 m)  04/15/18 5' 3.78" (1.62 m)  03/19/17 5' 2.5" (1.588 m)    General appearance: alert, cooperative and appears stated age Head: Normocephalic, without obvious abnormality, atraumatic Neck: no adenopathy, supple, symmetrical, trachea midline and thyroid normal to inspection and palpation Lungs: clear to auscultation bilaterally Cardiovascular: regular rate and rhythm  Breasts: normal appearance, no masses or tenderness Abdomen: soft, non-tender; non distended,  no masses,  no organomegaly Extremities: extremities normal, atraumatic, no cyanosis or edema Skin: Skin color, texture, turgor normal. No rashes or lesions Lymph nodes: Cervical, supraclavicular, and axillary nodes normal. No abnormal inguinal nodes palpated Neurologic: Grossly normal   Pelvic: External genitalia:  no lesions              Urethra:  normal appearing urethra with no masses, tenderness or lesions              Bartholins and Skenes: normal                 Vagina: normal appearing vagina with normal color and discharge, no lesions              Cervix: no lesions, IUD string 3 cm               Bimanual  Exam:  Uterus:  normal size, contour, position, consistency, mobility, non-tender and anteverted              Adnexa: no mass, fullness, tenderness               Rectovaginal: Confirms               Anus:  normal sphincter tone, no lesions  Terence Lux chaperoned for the exam.  A:  Well Woman with normal exam  HPV on pap last year  IUD check, doing well  Vit d def  Depression/anxiety well controlled with Celexa  P:   Pap with hpv  Screening labs, vit d  STD testing  She will schedule her mammogram  Discussed breast self exam  Discussed calcium and vit D intake  Continue Celexa

## 2019-04-26 ENCOUNTER — Other Ambulatory Visit: Payer: Self-pay

## 2019-04-27 ENCOUNTER — Encounter: Payer: Self-pay | Admitting: Obstetrics and Gynecology

## 2019-04-27 ENCOUNTER — Ambulatory Visit (INDEPENDENT_AMBULATORY_CARE_PROVIDER_SITE_OTHER): Payer: 59 | Admitting: Obstetrics and Gynecology

## 2019-04-27 ENCOUNTER — Other Ambulatory Visit (HOSPITAL_COMMUNITY)
Admission: RE | Admit: 2019-04-27 | Discharge: 2019-04-27 | Disposition: A | Discharge status: Home / Self Care | Payer: 59 | Source: Ambulatory Visit | Attending: Obstetrics and Gynecology | Admitting: Obstetrics and Gynecology

## 2019-04-27 VITALS — BP 134/64 | HR 88 | Temp 97.9°F | Ht 62.75 in | Wt 192.0 lb

## 2019-04-27 DIAGNOSIS — Z113 Encounter for screening for infections with a predominantly sexual mode of transmission: Secondary | ICD-10-CM | POA: Diagnosis present

## 2019-04-27 DIAGNOSIS — Z01419 Encounter for gynecological examination (general) (routine) without abnormal findings: Secondary | ICD-10-CM | POA: Diagnosis not present

## 2019-04-27 DIAGNOSIS — Z124 Encounter for screening for malignant neoplasm of cervix: Secondary | ICD-10-CM | POA: Insufficient documentation

## 2019-04-27 DIAGNOSIS — Z8659 Personal history of other mental and behavioral disorders: Secondary | ICD-10-CM

## 2019-04-27 DIAGNOSIS — E559 Vitamin D deficiency, unspecified: Secondary | ICD-10-CM

## 2019-04-27 DIAGNOSIS — B977 Papillomavirus as the cause of diseases classified elsewhere: Secondary | ICD-10-CM

## 2019-04-27 DIAGNOSIS — Z30431 Encounter for routine checking of intrauterine contraceptive device: Secondary | ICD-10-CM

## 2019-04-27 DIAGNOSIS — Z6834 Body mass index (BMI) 34.0-34.9, adult: Secondary | ICD-10-CM

## 2019-04-27 DIAGNOSIS — Z0000 Encounter for general adult medical examination without abnormal findings: Secondary | ICD-10-CM

## 2019-04-27 MED ORDER — CITALOPRAM HYDROBROMIDE 40 MG PO TABS: 40.0000 mg | ORAL_TABLET | Freq: Every day | ORAL | 3 refills | Status: DC

## 2019-04-27 NOTE — Patient Instructions (Signed)
EXERCISE AND DIET:  We recommended that you start or continue a regular exercise program for good health. Regular exercise means any activity that makes your heart beat faster and makes you sweat.  We recommend exercising at least 30 minutes per day at least 3 days a week, preferably 4 or 5.  We also recommend a diet low in fat and sugar.  Inactivity, poor dietary choices and obesity can cause diabetes, heart attack, stroke, and kidney damage, among others.    ALCOHOL AND SMOKING:  Women should limit their alcohol intake to no more than 7 drinks/beers/glasses of wine (combined, not each!) per week. Moderation of alcohol intake to this level decreases your risk of breast cancer and liver damage. And of course, no recreational drugs are part of a healthy lifestyle.  And absolutely no smoking or even second hand smoke. Most people know smoking can cause heart and lung diseases, but did you know it also contributes to weakening of your bones? Aging of your skin?  Yellowing of your teeth and nails?  CALCIUM AND VITAMIN D:  Adequate intake of calcium and Vitamin D are recommended.  The recommendations for exact amounts of these supplements seem to change often, but generally speaking 1,000 mg of calcium (between diet and supplement) and 800 units of Vitamin D per day seems prudent. Certain women may benefit from higher intake of Vitamin D.  If you are among these women, your doctor will have told you during your visit.    PAP SMEARS:  Pap smears, to check for cervical cancer or precancers,  have traditionally been done yearly, although recent scientific advances have shown that most women can have pap smears less often.  However, every woman still should have a physical exam from her gynecologist every year. It will include a breast check, inspection of the vulva and vagina to check for abnormal growths or skin changes, a visual exam of the cervix, and then an exam to evaluate the size and shape of the uterus and  ovaries.  And after 44 years of age, a rectal exam is indicated to check for rectal cancers. We will also provide age appropriate advice regarding health maintenance, like when you should have certain vaccines, screening for sexually transmitted diseases, bone density testing, colonoscopy, mammograms, etc.   MAMMOGRAMS:  All women over 40 years old should have a yearly mammogram. Many facilities now offer a "3D" mammogram, which may cost around $50 extra out of pocket. If possible,  we recommend you accept the option to have the 3D mammogram performed.  It both reduces the number of women who will be called back for extra views which then turn out to be normal, and it is better than the routine mammogram at detecting truly abnormal areas.    COLON CANCER SCREENING: Now recommend starting at age 45. At this time colonoscopy is not covered for routine screening until 50. There are take home tests that can be done between 45-49.   COLONOSCOPY:  Colonoscopy to screen for colon cancer is recommended for all women at age 50.  We know, you hate the idea of the prep.  We agree, BUT, having colon cancer and not knowing it is worse!!  Colon cancer so often starts as a polyp that can be seen and removed at colonscopy, which can quite literally save your life!  And if your first colonoscopy is normal and you have no family history of colon cancer, most women don't have to have it again for   10 years.  Once every ten years, you can do something that may end up saving your life, right?  We will be happy to help you get it scheduled when you are ready.  Be sure to check your insurance coverage so you understand how much it will cost.  It may be covered as a preventative service at no cost, but you should check your particular policy.      Breast Self-Awareness Breast self-awareness means being familiar with how your breasts look and feel. It involves checking your breasts regularly and reporting any changes to your  health care provider. Practicing breast self-awareness is important. A change in your breasts can be a sign of a serious medical problem. Being familiar with how your breasts look and feel allows you to find any problems early, when treatment is more likely to be successful. All women should practice breast self-awareness, including women who have had breast implants. How to do a breast self-exam One way to learn what is normal for your breasts and whether your breasts are changing is to do a breast self-exam. To do a breast self-exam: Look for Changes  1. Remove all the clothing above your waist. 2. Stand in front of a mirror in a room with good lighting. 3. Put your hands on your hips. 4. Push your hands firmly downward. 5. Compare your breasts in the mirror. Look for differences between them (asymmetry), such as: ? Differences in shape. ? Differences in size. ? Puckers, dips, and bumps in one breast and not the other. 6. Look at each breast for changes in your skin, such as: ? Redness. ? Scaly areas. 7. Look for changes in your nipples, such as: ? Discharge. ? Bleeding. ? Dimpling. ? Redness. ? A change in position. Feel for Changes Carefully feel your breasts for lumps and changes. It is best to do this while lying on your back on the floor and again while sitting or standing in the shower or tub with soapy water on your skin. Feel each breast in the following way:  Place the arm on the side of the breast you are examining above your head.  Feel your breast with the other hand.  Start in the nipple area and make  inch (2 cm) overlapping circles to feel your breast. Use the pads of your three middle fingers to do this. Apply light pressure, then medium pressure, then firm pressure. The light pressure will allow you to feel the tissue closest to the skin. The medium pressure will allow you to feel the tissue that is a little deeper. The firm pressure will allow you to feel the tissue  close to the ribs.  Continue the overlapping circles, moving downward over the breast until you feel your ribs below your breast.  Move one finger-width toward the center of the body. Continue to use the  inch (2 cm) overlapping circles to feel your breast as you move slowly up toward your collarbone.  Continue the up and down exam using all three pressures until you reach your armpit.  Write Down What You Find  Write down what is normal for each breast and any changes that you find. Keep a written record with breast changes or normal findings for each breast. By writing this information down, you do not need to depend only on memory for size, tenderness, or location. Write down where you are in your menstrual cycle, if you are still menstruating. If you are having trouble noticing differences   in your breasts, do not get discouraged. With time you will become more familiar with the variations in your breasts and more comfortable with the exam. How often should I examine my breasts? Examine your breasts every month. If you are breastfeeding, the best time to examine your breasts is after a feeding or after using a breast pump. If you menstruate, the best time to examine your breasts is 5-7 days after your period is over. During your period, your breasts are lumpier, and it may be more difficult to notice changes. When should I see my health care provider? See your health care provider if you notice:  A change in shape or size of your breasts or nipples.  A change in the skin of your breast or nipples, such as a reddened or scaly area.  Unusual discharge from your nipples.  A lump or thick area that was not there before.  Pain in your breasts.  Anything that concerns you.   Mammogram Facilities  Yearly screening mammograms are recommended for women beginning at age 36. For a routine screening mammogram, you may schedule the appointment and have it done at the location of your choice.   Please ask the facility to send the results to our office. (fax [phone]) Location options include:  [facility], [place], [place] [address] [phone]  [facility] [address] [place], [place] [address] [phone]

## 2019-04-28 LAB — COMPREHENSIVE METABOLIC PANEL
ALT: 13 IU/L (ref 0–32)
AST: 15 IU/L (ref 0–40)
Albumin/Globulin Ratio: 1.6 (ref 1.2–2.2)
Albumin: 4.4 g/dL (ref 3.8–4.8)
Alkaline Phosphatase: 58 IU/L (ref 39–117)
BUN/Creatinine Ratio: 19 (ref 9–23)
BUN: 12 mg/dL (ref 6–24)
Bilirubin Total: 0.3 mg/dL (ref 0.0–1.2)
CO2: 22 mmol/L (ref 20–29)
Calcium: 9.1 mg/dL (ref 8.7–10.2)
Chloride: 104 mmol/L (ref 96–106)
Creatinine, Ser: 0.64 mg/dL (ref 0.57–1.00)
GFR calc Af Amer: 126 mL/min/{1.73_m2} (ref >59)
GFR calc non Af Amer: 110 mL/min/{1.73_m2} (ref >59)
Globulin, Total: 2.7 g/dL (ref 1.5–4.5)
Glucose: 98 mg/dL (ref 65–99)
Potassium: 4.3 mmol/L (ref 3.5–5.2)
Sodium: 140 mmol/L (ref 134–144)
Total Protein: 7.1 g/dL (ref 6.0–8.5)

## 2019-04-28 LAB — CBC
Hematocrit: 41.5 % (ref 34.0–46.6)
Hemoglobin: 13.7 g/dL (ref 11.1–15.9)
MCH: 29.2 pg (ref 26.6–33.0)
MCHC: 33 g/dL (ref 31.5–35.7)
MCV: 89 fL (ref 79–97)
Platelets: 241 10*3/uL (ref 150–450)
RBC: 4.69 x10E6/uL (ref 3.77–5.28)
RDW: 13.1 % (ref 11.7–15.4)
WBC: 4.7 10*3/uL (ref 3.4–10.8)

## 2019-04-28 LAB — HEP, RPR, HIV PANEL
HIV Screen 4th Generation wRfx: NONREACTIVE
Hepatitis B Surface Ag: NEGATIVE
RPR Ser Ql: NONREACTIVE

## 2019-04-28 LAB — LIPID PANEL
Chol/HDL Ratio: 3.2 ratio (ref 0.0–4.4)
Cholesterol, Total: 154 mg/dL (ref 100–199)
HDL: 48 mg/dL
LDL Chol Calc (NIH): 91 mg/dL (ref 0–99)
Triglycerides: 81 mg/dL (ref 0–149)
VLDL Cholesterol Cal: 15 mg/dL (ref 5–40)

## 2019-04-28 LAB — HEPATITIS C ANTIBODY: Hep C Virus Ab: <0.1 s/co ratio (ref 0.0–0.9)

## 2019-04-28 LAB — VITAMIN D 25 HYDROXY (VIT D DEFICIENCY, FRACTURES): Vit D, 25-Hydroxy: 20.1 ng/mL — ABNORMAL LOW (ref 30.0–100.0)

## 2019-04-28 LAB — TSH: TSH: 1.83 u[IU]/mL (ref 0.450–4.500)

## 2019-04-29 ENCOUNTER — Encounter: Payer: Self-pay | Admitting: Obstetrics and Gynecology

## 2019-04-29 LAB — CYTOLOGY - PAP
Chlamydia: NEGATIVE
Comment: NEGATIVE
Comment: NEGATIVE
Comment: NEGATIVE
Comment: NORMAL
Diagnosis: UNDETERMINED — AB
High risk HPV: POSITIVE — AB
Neisseria Gonorrhea: NEGATIVE
Trichomonas: NEGATIVE

## 2019-05-02 ENCOUNTER — Telehealth: Payer: Self-pay | Admitting: Obstetrics and Gynecology

## 2019-05-02 DIAGNOSIS — R87610 Atypical squamous cells of undetermined significance on cytologic smear of cervix (ASC-US): Secondary | ICD-10-CM

## 2019-05-02 NOTE — Telephone Encounter (Signed)
Patient sent the following correspondence through [facility].  Seeing that my HPV came back positive.  What is it and how is it treated?

## 2019-05-02 NOTE — Telephone Encounter (Addendum)
Spoke to pt. Pt states seeing results for Pap smear with ASCUS, +HR HPV.   Pt given recommendations per Dr Talbert Nan.  Pt verbalized understanding.  Pt scheduled for colposcopy on 05/04/2019 with Dr Talbert Nan. Pt instructed to take Motrin 800 mg with food and water one hour before procedure. Pt agreeable.  Orders placed for colpo. Pt aware of call for benefits.  Cc: Hay[place] for precert.   Encounter closed.

## 2019-05-03 ENCOUNTER — Telehealth: Payer: Self-pay | Admitting: Obstetrics and Gynecology

## 2019-05-03 ENCOUNTER — Other Ambulatory Visit: Payer: Self-pay

## 2019-05-03 NOTE — Telephone Encounter (Signed)
Call placed to convey benefits for colposcopy. °

## 2019-05-03 NOTE — Telephone Encounter (Signed)
Patient returned my call and I conveyed the benefits. Patient understands/agreeable with the benefits. Patient is aware of the cancellation policy. Appointment scheduled 05/04/19.

## 2019-05-04 ENCOUNTER — Encounter: Payer: Self-pay | Admitting: Obstetrics and Gynecology

## 2019-05-04 ENCOUNTER — Other Ambulatory Visit: Payer: Self-pay

## 2019-05-04 ENCOUNTER — Other Ambulatory Visit (HOSPITAL_COMMUNITY)
Admission: RE | Admit: 2019-05-04 | Discharge: 2019-05-04 | Disposition: A | Discharge status: Home / Self Care | Payer: 59 | Source: Ambulatory Visit | Attending: Obstetrics and Gynecology | Admitting: Obstetrics and Gynecology

## 2019-05-04 ENCOUNTER — Ambulatory Visit (INDEPENDENT_AMBULATORY_CARE_PROVIDER_SITE_OTHER): Payer: 59 | Admitting: Obstetrics and Gynecology

## 2019-05-04 VITALS — BP 140/78 | HR 96 | Temp 97.7°F | Ht 62.25 in | Wt 195.6 lb

## 2019-05-04 DIAGNOSIS — R87810 Cervical high risk human papillomavirus (HPV) DNA test positive: Secondary | ICD-10-CM | POA: Insufficient documentation

## 2019-05-04 DIAGNOSIS — R87610 Atypical squamous cells of undetermined significance on cytologic smear of cervix (ASC-US): Secondary | ICD-10-CM | POA: Insufficient documentation

## 2019-05-04 NOTE — Patient Instructions (Signed)

## 2019-05-04 NOTE — Progress Notes (Signed)
GYNECOLOGY  VISIT   HPI: 44 y.o.   Legally Separated Black or African American Not Hispanic or Latino  female   [phone] with No LMP recorded. (Menstrual status: IUD).   here for colposcopy    Pap from 3/20 was normal, +HR HPV. Pap fro 04/27/19 ASCUS, +HPV  GYNECOLOGIC HISTORY: No LMP recorded. (Menstrual status: IUD). Contraception:IUD Menopausal hormone therapy: none         OB History    Gravida  4   Para  2   Term  2   Preterm      AB  2   Living  2     SAB  2   TAB      Ectopic      Multiple      Live Births  2              Patient Active Problem List   Diagnosis Date Noted  . Right leg DVT ([place]) 01/07/2016    Past Medical History:  Diagnosis Date  . Anxiety   . DVT (deep venous thrombosis) ([place]) 01/07/2016   right leg    Past Surgical History:  Procedure Laterality Date  . CESAREAN SECTION    . INTRAUTERINE DEVICE INSERTION  01/29/2016   Mirena  . TUBAL LIGATION      Current Outpatient Medications  Medication Sig Dispense Refill  . citalopram (CELEXA) 40 MG tablet Take 1 tablet (40 mg total) by mouth daily. 90 tablet 3  . levonorgestrel (MIRENA) 20 MCG/24HR IUD 1 each by Intrauterine route once.     No current facility-administered medications for this visit.     ALLERGIES: Patient has no known allergies.  Family History  Problem Relation Age of Onset  . Hypertension Mother   . Breast cancer Maternal Aunt        unsure of age  . Breast cancer Cousin        unsure of age    Social History   Socioeconomic History  . Marital status: Legally Separated    Spouse name: Not on file  . Number of children: Not on file  . Years of education: Not on file  . Highest education level: Not on file  Occupational History  . Not on file  Tobacco Use  . Smoking status: Former Smoker    Packs/day: 0.50    Years: 17.00    Pack years: 8.50  . Smokeless tobacco: Former Systems developer    Quit date: 11/27/2012  Substance and Sexual Activity  .  Alcohol use: Yes    Comment: very occasional  . Drug use: No  . Sexual activity: Not Currently    Partners: Male    Birth control/protection: I.U.D.  Other Topics Concern  . Not on file  Social History Narrative  . Not on file   Social Determinants of Health   Financial Resource Strain:   . Difficulty of Paying Living Expenses:   Food Insecurity:   . Worried About Charity fundraiser in the Last Year:   . Arboriculturist in the Last Year:   Transportation Needs:   . Film/video editor (Medical):   Marland Kitchen Lack of Transportation (Non-Medical):   Physical Activity:   . Days of Exercise per Week:   . Minutes of Exercise per Session:   Stress:   . Feeling of Stress :   Social Connections:   . Frequency of Communication with Friends and Family:   . Frequency of Social Gatherings with  Friends and Family:   . Attends Religious Services:   . Active Member of Clubs or Organizations:   . Attends Archivist Meetings:   Marland Kitchen Marital Status:   Intimate Partner Violence:   . Fear of Current or Ex-Partner:   . Emotionally Abused:   Marland Kitchen Physically Abused:   . Sexually Abused:     Review of Systems  All other systems reviewed and are negative.   PHYSICAL EXAMINATION:    BP 140/78   Pulse 96   Temp 97.7 F (36.5 C)   Ht 5' 2.25" (1.581 m)   Wt 195 lb 9.6 oz (88.7 kg)   SpO2 96%   BMI 35.49 kg/m     General appearance: alert, cooperative and appears stated age  Pelvic: External genitalia:  no lesions              Urethra:  normal appearing urethra with no masses, tenderness or lesions              Bartholins and Skenes: normal                 Vagina: normal appearing vagina with normal color and discharge, no lesions              Cervix: no lesions, IUD string 3 cm  Colposcopy: satisfactory with the use of a cotton swab. Mild aceto-white changes at 4 o'clock, biopsy taken. ECC done. Lugols examination of the upper vagina, several areas with decreased lugols uptake,  biopsy taken at 9 o'clock. Biopsy sites treated with silver nitrate.   Chaperone was present for exam.  ASSESSMENT ASCUS pap with +HPV    PLAN Colposcopy with cervical and vaginal biopsy and ECC Further plans depending on results.    An After Visit Summary was printed and given to the patient.

## 2019-05-05 ENCOUNTER — Telehealth: Payer: Self-pay | Admitting: Obstetrics and Gynecology

## 2019-05-05 ENCOUNTER — Encounter: Payer: Self-pay | Admitting: Obstetrics and Gynecology

## 2019-05-05 LAB — SURGICAL PATHOLOGY

## 2019-05-05 NOTE — Telephone Encounter (Signed)
Patient saw her results in [place] and does not understand the results.

## 2019-05-05 NOTE — Telephone Encounter (Signed)
Barbara Logan, RN  05/05/2019 12:21 PM EDT    Spoke with patient, advised per Dr. Talbert Nan. Patient verbalizes understanding and is agreeable. Next AEX 05/03/20 at 8:30am.   12 recall placed.      Encounter closed.

## 2019-05-05 NOTE — Telephone Encounter (Signed)
-----   Message from [name], MD sent at 05/05/2019 10:04 AM EDT ----- Please let the patient know that her cervical biopsy returned with mild atypia, c/w hpv effect. Her other biopsies are normal.  She needs another pap and hpv at her annual exam next year. 12 month recall

## 2019-05-05 NOTE — Telephone Encounter (Signed)
See telephone encounter dated 05/05/19.   Encounter closed.

## 2019-06-28 ENCOUNTER — Other Ambulatory Visit: Payer: Self-pay

## 2019-06-28 NOTE — Progress Notes (Signed)
Documented COVID Vaccine.

## 2020-05-03 ENCOUNTER — Ambulatory Visit (INDEPENDENT_AMBULATORY_CARE_PROVIDER_SITE_OTHER): Payer: 59 | Admitting: Obstetrics and Gynecology

## 2020-05-03 ENCOUNTER — Other Ambulatory Visit: Payer: Self-pay

## 2020-05-07 NOTE — Progress Notes (Signed)
45 y.o. [id number] Legally Separated Black or African American Not Hispanic or Latino female here for annual exam. She is concerned about her weight gain patient states that she knows she needs a better diet.  She had a DVT on OCP's Has a mirena IUD, inserted in 1/18.  She has a light monthly cycle, only needs a panty liner, occasional mild cramps (improved). Sexually active, same partner for the last year. Things are going well.     She has been doing great, went off of her antidepressants.   No LMP recorded. (Menstrual status: IUD).          Sexually active: Yes.    The current method of family planning is IUD.    Exercising: Yes.    The patient has a physically strenuous job, but has no regular exercise apart from work.  Smoker:  no  Health Maintenance: Pap:  04/27/19 ASCUS, +HPV, colpo with mild atypia and hpv effect.  04/15/18 Normal HR HPV +, 03-07-15 WNL NEG HR HPV History of abnormal Pap:  Yes HPV +  MMG:  04/08/17  Density B Bi-rads 1 Neg  BMD:   none Colonoscopy: none TDaP:  03/07/15  Gardasil: none    reports that she has quit smoking. She has a 8.50 pack-year smoking history. She quit smokeless tobacco use about 7 years ago. She reports current alcohol use. She reports that she does not use drugs. Has a business making and selling fancy soaps. Still working at an [employer], stopped going to college. 2 grown daughters.   Past Medical History:  Diagnosis Date  . Anxiety   . DVT (deep venous thrombosis) ([place]) 01/07/2016   right leg    Past Surgical History:  Procedure Laterality Date  . CESAREAN SECTION    . INTRAUTERINE DEVICE INSERTION  01/29/2016   Mirena  . TUBAL LIGATION      Current Outpatient Medications  Medication Sig Dispense Refill  . levonorgestrel (MIRENA) 20 MCG/24HR IUD 1 each by Intrauterine route once.     No current facility-administered medications for this visit.    Family History  Problem Relation Age of Onset  . Hypertension Mother   .  Breast cancer Maternal Aunt        unsure of age  . Breast cancer Cousin        unsure of age    Review of Systems  All other systems reviewed and are negative.   Exam:   BP 138/80   Pulse 99   Ht 5' 2.25" (1.581 m)   Wt 207 lb (93.9 kg)   SpO2 99%   BMI 37.56 kg/m   Weight change: @WEIGHTCHANGE @ Height:   Height: 5' 2.25" (158.1 cm)  Ht Readings from Last 3 Encounters:  05/08/20 5' 2.25" (1.581 m)  05/04/19 5' 2.25" (1.581 m)  04/27/19 5' 2.75" (1.594 m)    General appearance: alert, cooperative and appears stated age Head: Normocephalic, without obvious abnormality, atraumatic Neck: no adenopathy, supple, symmetrical, trachea midline and thyroid normal to inspection and palpation Lungs: clear to auscultation bilaterally Cardiovascular: regular rate and rhythm Breasts: normal appearance, no masses or tenderness Abdomen: soft, non-tender; non distended,  no masses,  no organomegaly Extremities: extremities normal, atraumatic, no cyanosis or edema Skin: Skin color, texture, turgor normal. No rashes or lesions Lymph nodes: Cervical, supraclavicular, and axillary nodes normal. No abnormal inguinal nodes palpated Neurologic: Grossly normal   Pelvic: External genitalia:  no lesions  Urethra:  normal appearing urethra with no masses, tenderness or lesions              Bartholins and Skenes: normal                 Vagina: normal appearing vagina with normal color and discharge, no lesions              Cervix: no lesions, IUD string 3 cm               Bimanual Exam:  Uterus:  anteverted, mobile, non tender, not appreciably enlarged              Adnexa: no mass, fullness, tenderness               Rectovaginal: Confirms               Anus:  normal sphincter tone, no lesions  Gae Dry chaperoned for the exam.  1. Well woman exam Discussed breast self exam Discussed calcium and vit D intake Mammogram overdue, # given  2. Screening for cervical cancer -  Cytology - PAP  3. Screening examination for STD (sexually transmitted disease) - HIV Antibody (routine testing w rflx) - Hepatitis C antibody - RPR - Cytology - PAP  4. Laboratory exam ordered as part of routine general medical examination - CBC - Comprehensive metabolic panel - Lipid panel  5. BMI 37.0-37.9, adult She is going to try to exercise, feels she is eating healthy - Hemoglobin A1c - Lipid panel - TSH  6. Vitamin D deficiency - VITAMIN D 25 Hydroxy (Vit-D Deficiency, Fractures)

## 2020-05-08 ENCOUNTER — Other Ambulatory Visit (HOSPITAL_COMMUNITY)
Admission: RE | Admit: 2020-05-08 | Discharge: 2020-05-08 | Disposition: A | Discharge status: Home / Self Care | Payer: No Typology Code available for payment source | Source: Ambulatory Visit | Attending: Obstetrics and Gynecology | Admitting: Obstetrics and Gynecology

## 2020-05-08 ENCOUNTER — Ambulatory Visit (INDEPENDENT_AMBULATORY_CARE_PROVIDER_SITE_OTHER): Payer: 59 | Admitting: Obstetrics and Gynecology

## 2020-05-08 ENCOUNTER — Encounter: Payer: Self-pay | Admitting: Obstetrics and Gynecology

## 2020-05-08 ENCOUNTER — Other Ambulatory Visit: Payer: Self-pay

## 2020-05-08 VITALS — BP 138/80 | HR 99 | Ht 62.25 in | Wt 207.0 lb

## 2020-05-08 DIAGNOSIS — Z6837 Body mass index (BMI) 37.0-37.9, adult: Secondary | ICD-10-CM

## 2020-05-08 DIAGNOSIS — Z0000 Encounter for general adult medical examination without abnormal findings: Secondary | ICD-10-CM | POA: Diagnosis not present

## 2020-05-08 DIAGNOSIS — Z113 Encounter for screening for infections with a predominantly sexual mode of transmission: Secondary | ICD-10-CM | POA: Diagnosis not present

## 2020-05-08 DIAGNOSIS — Z01419 Encounter for gynecological examination (general) (routine) without abnormal findings: Secondary | ICD-10-CM

## 2020-05-08 DIAGNOSIS — Z124 Encounter for screening for malignant neoplasm of cervix: Secondary | ICD-10-CM | POA: Diagnosis not present

## 2020-05-08 DIAGNOSIS — E559 Vitamin D deficiency, unspecified: Secondary | ICD-10-CM

## 2020-05-08 NOTE — Patient Instructions (Signed)

## 2020-05-09 ENCOUNTER — Encounter: Payer: Self-pay | Admitting: Obstetrics and Gynecology

## 2020-05-09 LAB — COMPREHENSIVE METABOLIC PANEL
AG Ratio: 1.3 (calc) (ref 1.0–2.5)
ALT: 15 U/L (ref 6–29)
AST: 16 U/L (ref 10–30)
Albumin: 4.2 g/dL (ref 3.6–5.1)
Alkaline phosphatase (APISO): 62 U/L (ref 31–125)
BUN: 11 mg/dL (ref 7–25)
CO2: 23 mmol/L (ref 20–32)
Calcium: 9.5 mg/dL (ref 8.6–10.2)
Chloride: 103 mmol/L (ref 98–110)
Creat: 0.67 mg/dL (ref 0.50–1.10)
Globulin: 3.2 g/dL (calc) (ref 1.9–3.7)
Glucose, Bld: 85 mg/dL (ref 65–99)
Potassium: 4.5 mmol/L (ref 3.5–5.3)
Sodium: 141 mmol/L (ref 135–146)
Total Bilirubin: 0.5 mg/dL (ref 0.2–1.2)
Total Protein: 7.4 g/dL (ref 6.1–8.1)

## 2020-05-09 LAB — HEMOGLOBIN A1C
Hgb A1c MFr Bld: 6.3 % of total Hgb — ABNORMAL HIGH (ref <5.7)
Mean Plasma Glucose: 134 mg/dL
eAG (mmol/L): 7.4 mmol/L

## 2020-05-09 LAB — VITAMIN D 25 HYDROXY (VIT D DEFICIENCY, FRACTURES): Vit D, 25-Hydroxy: 30 ng/mL (ref 30–100)

## 2020-05-09 LAB — CBC
HCT: 40.2 % (ref 35.0–45.0)
Hemoglobin: 12.7 g/dL (ref 11.7–15.5)
MCH: 27.3 pg (ref 27.0–33.0)
MCHC: 31.6 g/dL — ABNORMAL LOW (ref 32.0–36.0)
MCV: 86.3 fL (ref 80.0–100.0)
MPV: 10.5 fL (ref 7.5–12.5)
Platelets: 290 10*3/uL (ref 140–400)
RBC: 4.66 10*6/uL (ref 3.80–5.10)
RDW: 12.9 % (ref 11.0–15.0)
WBC: 5 10*3/uL (ref 3.8–10.8)

## 2020-05-09 LAB — TSH: TSH: 1.33 mIU/L

## 2020-05-09 LAB — LIPID PANEL
Cholesterol: 148 mg/dL (ref <200)
HDL: 54 mg/dL (ref >=50)
LDL Cholesterol (Calc): 80 mg/dL (calc)
Non-HDL Cholesterol (Calc): 94 mg/dL (calc) (ref <130)
Total CHOL/HDL Ratio: 2.7 (calc) (ref <5.0)
Triglycerides: 67 mg/dL (ref <150)

## 2020-05-09 LAB — HEPATITIS C ANTIBODY
Hepatitis C Ab: NONREACTIVE
SIGNAL TO CUT-OFF: 0.01 (ref <1.00)

## 2020-05-09 LAB — RPR: RPR Ser Ql: NONREACTIVE

## 2020-05-09 LAB — HIV ANTIBODY (ROUTINE TESTING W REFLEX): HIV 1&2 Ab, 4th Generation: NONREACTIVE

## 2020-05-10 LAB — CYTOLOGY - PAP
Chlamydia: NEGATIVE
Comment: NEGATIVE
Comment: NEGATIVE
Comment: NEGATIVE
Comment: NORMAL
Diagnosis: NEGATIVE
Diagnosis: REACTIVE
High risk HPV: NEGATIVE
Neisseria Gonorrhea: NEGATIVE
Trichomonas: NEGATIVE

## 2020-05-14 DIAGNOSIS — R7309 Other abnormal glucose: Secondary | ICD-10-CM

## 2020-07-10 ENCOUNTER — Encounter: Payer: Self-pay | Admitting: Dietician

## 2020-07-10 ENCOUNTER — Encounter: Payer: No Typology Code available for payment source | Attending: Obstetrics and Gynecology | Admitting: Dietician

## 2020-07-10 ENCOUNTER — Other Ambulatory Visit: Payer: Self-pay

## 2020-07-10 VITALS — Ht 62.25 in | Wt 204.2 lb

## 2020-07-10 DIAGNOSIS — R7303 Prediabetes: Secondary | ICD-10-CM | POA: Diagnosis not present

## 2020-07-10 NOTE — Patient Instructions (Signed)
Consider taking a daily Vitamin D supplement. Keep up the walking!  Every time you walk it lowers your blood sugar! Work towards eating three meals a day, about 5-6 hours apart! Begin to recognize carbohydrates in your food choices! Have 3 carb choices at each meal (45 g).  Begin to build your meals using the proportions of the Balanced Plate. First, select your carb choice(s) for the meal, and determine how much you should have to equal 3 carb choices (45 g). Next, select your source of protein to pair with your carb choice(s). Finally, complete the remaining half of your meal with a variety of non-starchy vegetables.

## 2020-07-10 NOTE — Progress Notes (Signed)
Medical Nutrition Therapy  Appointment Start time:  [phone]  Appointment End time:  1600  Primary concerns today: Prediabetes  Referral diagnosis: R73.09 - Elevated Hemoglobin A1c Preferred learning style:  No preference indicated Learning readiness: Ready   NUTRITION ASSESSMENT   Anthropometrics  Ht: 5' 2.5" Wt: 204.2 lbs Body mass index is 37.05 kg/m.   Clinical Medical Hx: DVT of rt leg, Anemia Medications: Mirena  Labs: A1c - 6.1%, Vitamin D - 30 (in range, low) Notable Signs/Symptoms: N/A  Lifestyle & Dietary Hx Pt works for [employer], coordinates patient care. Mostly sedentary during the work day. Pt also has their own business making shea butter, sugar scrubs, and bath salts as well. Pt is very busy with both of these jobs, but states they are not stressful. Pt is also very active in their church. Pt works with a Transport planner, states they were previously dealing with depression. Pt states they would go to the gym 4 times a week before the pandemic, but has not been able to get back to that level of physical activity. Pt walks a couple of miles 3 times a week. Pt does not eat beef or pork. Pt may skip breakfast and go a long time in between lunch and dinner. Pt likes to eat dried cranberries as a snack, may mix them with walnuts, pecans, or almonds. Pt drinks sweet tea or fruit infused water for flavored beverages, may have a ginger ale or ginger beer if they have indigestion.   Estimated daily fluid intake: 64 oz Supplements: N/A Sleep: Depends on the day, takes a while to wind down Stress / self-care: Low, makes  Current average weekly physical activity: walks 3 days  24-Hr Dietary Recall First Meal: Coffee, w 2 creams, 3 sugars Snack: dried cranberries Second Meal: Grilled chicken salad, crackers and cheese, sweet tea Snack: none Third Meal: [place] chicken breast, pineapple, wild grain rice, tea Snack: none Beverages: water, sweet tea    NUTRITION DIAGNOSIS  NB-1.1  Food and nutrition-related knowledge deficit As related to Prediabetes.  As evidenced by A1c of 6.1%, skipping and large gaps between meals, and over consumption of dried fruits.   NUTRITION INTERVENTION  Nutrition education (E-1) on the following topics:  Educated patient on the pathophysiology of diabetes. This includes why our bodies need circulating blood sugar, the relationship between insulin and blood sugar, and the results of insulin resistance and/or pancreatic insufficiency on the development of diabetes. Educated patient on factors that contribute to elevation of blood sugars, such as stress, illness, injury,and food choices. Discussed the role that physical activity plays in lowering blood sugar. Educate patient on the three main macronutrients. Protein, fats, and carbohydrates. Discussed how each of these macronutrients affect blood sugar levels, especially carbohydrate, and the importance of eating a consistent amount of carbohydrate throughout the day. Educated patient on carbohydrate counting, 15g of carbohydrate equals one carb choice. Advised patient on the importance of consistently checking their blood sugar, and recognizing how lifestyle and food choices affect those numbers.    Handouts Provided Include  Balanced Plate Balanced Plate Food List Diabetes Label Reading Tips Yellow Meal Plan Card   Learning Style & Readiness for Change Teaching method utilized: Visual & Auditory  Demonstrated degree of understanding via: Teach Back  Barriers to learning/adherence to lifestyle change: None   Goals Established by Pt Consider taking a daily Vitamin D supplement. Keep up the walking!  Every time you walk it lowers your blood sugar! Work towards eating three meals a  day, about 5-6 hours apart! Begin to recognize carbohydrates in your food choices! Have 3 carb choices at each meal (45 g).  Begin to build your meals using the proportions of the Balanced Plate. First, select  your carb choice(s) for the meal, and determine how much you should have to equal 3 carb choices (45 g). Next, select your source of protein to pair with your carb choice(s). Finally, complete the remaining half of your meal with a variety of non-starchy vegetables.   MONITORING & EVALUATION Dietary intake, weekly physical activity, and carb recognition in 2 months.  Next Steps  Patient is to follow up with RDN.

## 2020-09-10 ENCOUNTER — Ambulatory Visit: Payer: No Typology Code available for payment source | Admitting: Dietician

## 2021-03-29 ENCOUNTER — Encounter: Payer: Self-pay | Admitting: Obstetrics and Gynecology

## 2021-03-29 DIAGNOSIS — Z1211 Encounter for screening for malignant neoplasm of colon: Secondary | ICD-10-CM

## 2021-03-29 NOTE — Telephone Encounter (Signed)
Okay to place referral

## 2021-04-01 ENCOUNTER — Encounter: Payer: Self-pay | Admitting: Internal Medicine

## 2021-04-29 ENCOUNTER — Ambulatory Visit (AMBULATORY_SURGERY_CENTER): Payer: No Typology Code available for payment source

## 2021-04-29 VITALS — Ht 62.0 in | Wt 208.0 lb

## 2021-04-29 DIAGNOSIS — Z1211 Encounter for screening for malignant neoplasm of colon: Secondary | ICD-10-CM

## 2021-04-29 MED ORDER — ONDANSETRON HCL 4 MG PO TABS: 4.0000 mg | ORAL_TABLET | ORAL | 0 refills | Status: DC

## 2021-04-29 NOTE — Progress Notes (Signed)
No egg or soy allergy known to patient  ?No issues known to pt with past sedation with any surgeries or procedures ?Patient denies ever being told they had issues or difficulty with intubation  ?No FH of Malignant Hyperthermia ?Pt is not on diet pills ?Pt is not on  home 02  ?Pt is not on blood thinners  ?Pt denies issues with constipation  ?No A fib or A flutter ? ? ? ?Due to the COVID-19 pandemic we are asking patients to follow certain guidelines in PV and the [place]   ?Pt aware of COVID protocols and [facility] guidelines  ? ?PV completed over the phone. Pt verified name, DOB, address and insurance during PV today.  ?Pt mailed instruction packet with copy of consent form to read and not return, and instructions.  ?Pt encouraged to call with questions or issues.  ?If pt has My chart, procedure instructions also sent via My Chart  ? ?

## 2021-05-09 ENCOUNTER — Encounter: Payer: Self-pay | Admitting: Internal Medicine

## 2021-05-09 NOTE — Progress Notes (Signed)
46 y.o. [id number] Significant Other Black or African American Not Hispanic or Latino female here for annual exam.  She has a h/o a DVT while on OCP's. She has a mirean IUD, placed in 1/18. She has light monthly cycles x 3-4 days.  ?Same partner x 2.5 years, not living together. No dyspareunia. ? ?She has some urgency to void for the last 4-5 months. No leakage. Voiding normal amounts, no pain.  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  ? ?Patient's last menstrual period was 04/23/2021.          ?Sexually active: Yes.    ?The current method of family planning is IUD.    ?Exercising: No.  The patient does not participate in regular exercise at present. ?Smoker:  no ? ?Health Maintenance: ?Pap: 05/08/20 WNL Hr HPV Neg,  04/27/19 ASCUS, +HPV ?History of abnormal Pap:  yes 2021 ASCUS, + HPV Colpo mild atypia with hpv effect  ?MMG:  04/08/17 density B Bi-ras 1 neg  ?BMD:   none  ?Colonoscopy: none scheduled for next week.  ?TDaP:  03/07/15  ?Gardasil: none  ? ? reports that she quit smoking about 8 years ago. Her smoking use included cigarettes. She has a 8.50 pack-year smoking history. She has never used smokeless tobacco. She reports current alcohol use. She reports that she does not use drugs. Has a business making and selling fancy soaps. Still working at an [employer]. 2 grown daughters.  ? ?Past Medical History:  ?Diagnosis Date  ? Anemia   ? Anxiety   ? DVT (deep venous thrombosis) ([place]) 01/07/2016  ? right leg, from BCP's then blood thinners used, now IUD to prevent clotting  ? ? ?Past Surgical History:  ?Procedure Laterality Date  ? [place]  ? epidural  ? INTRAUTERINE DEVICE INSERTION  01/29/2016  ? Mirena  ? TUBAL LIGATION  1999  ? ? ?Current Outpatient Medications  ?Medication Sig  Dispense Refill  ? levonorgestrel (MIRENA) 20 MCG/24HR IUD 1 each by Intrauterine route once.    ? ondansetron (ZOFRAN) 4 MG tablet Take 1 tablet (4 mg total) by mouth as directed for 2 doses. Take one Zofran 4 mg tablet 30-60 minutes before each prep dose (Patient not taking: Reported on 05/15/2021) 2 tablet 0  ? ?No current facility-administered medications for this visit.  ? ? ?Family History  ?Problem Relation Age of Onset  ? Hypertension Mother   ? Breast cancer Maternal Aunt   ?     unsure of age  ? Breast cancer Cousin   ?     unsure of age  ? Colon cancer Neg Hx   ? Colon polyps Neg Hx   ? Esophageal cancer Neg Hx   ? Rectal cancer Neg Hx   ? Stomach cancer Neg Hx   ? ? ?Review of Systems  ?All other systems reviewed and are negative. ? ?Exam:   ?BP 110/82   Pulse 66   Ht 5' 2.25" (1.581 m)   Wt 217 lb (98.4 kg)   LMP 04/23/2021 Comment: light periods  SpO2 100%   BMI 39.37 kg/m?   Weight change: '@WEIGHTCHANGE'$ @ Height:   Height: 5' 2.25" (158.1 cm)  ?Ht Readings from Last 3 Encounters:  ?05/15/21 5' 2.25" (1.581 m)  ?04/29/21 '5\' 2"'$  (1.575 m)  ?07/10/20 5' 2.25" (  1.581 m)  ? ? ?General appearance: alert, cooperative and appears stated age ?Head: Normocephalic, without obvious abnormality, atraumatic ?Neck: no adenopathy, supple, symmetrical, trachea midline and thyroid normal to inspection and palpation ?Lungs: clear to auscultation bilaterally ?Cardiovascular: regular rate and rhythm ?Breasts: normal appearance, no masses or tenderness ?Abdomen: soft, non-tender; non distended,  no masses,  no organomegaly ?Extremities: extremities normal, atraumatic, no cyanosis or edema ?Skin: Skin color, texture, turgor normal. No rashes or lesions ?Lymph nodes: Cervical, supraclavicular, and axillary nodes normal. ?No abnormal inguinal nodes palpated ?Neurologic: Grossly normal ? ? ?Pelvic: External genitalia:  no lesions ?             Urethra:  normal appearing urethra with no masses, tenderness or lesions ?              Bartholins and Skenes: normal    ?             Vagina: normal appearing vagina with normal color and discharge, no lesions ?             Cervix: no lesions and IUD string 2 cm ?              ?Bimanual Exam:  Uterus:   no masses or tenderness ?             Adnexa: no mass, fullness, tenderness ?              Rectovaginal: Confirms ?              Anus:  normal sphincter tone, no lesions ? ?Wandra Scot, CMA chaperoned for the exam. ? ?1. Well woman exam ?Discussed breast self exam ?Discussed calcium and vit D intake ?Mammogram overdue, she will schedule ?Colonoscopy next week ? ?2. Screening for cervical cancer ?- Cytology - PAP ? ?3. IUD check up ?Doing well ? ?4. Urinary urgency ?- Urinalysis,Complete w/RFL Culture ? ?5. Prediabetes ?- Hemoglobin A1c ? ?6. Weight gain ?- Lipid panel ?- TSH ? ?7. BMI 39.0-39.9,adult ?- Lipid panel ?- TSH ? ?8. Vitamin D deficiency ?Not currently on vit d ?- VITAMIN D 25 Hydroxy (Vit-D Deficiency, Fractures) ? ?9. Laboratory exam ordered as part of routine general medical examination ?- CBC ?- Comprehensive metabolic panel ? ? ?

## 2021-05-15 ENCOUNTER — Other Ambulatory Visit: Payer: Self-pay | Admitting: Obstetrics and Gynecology

## 2021-05-15 ENCOUNTER — Ambulatory Visit (INDEPENDENT_AMBULATORY_CARE_PROVIDER_SITE_OTHER): Payer: No Typology Code available for payment source | Admitting: Obstetrics and Gynecology

## 2021-05-15 ENCOUNTER — Encounter: Payer: Self-pay | Admitting: Obstetrics and Gynecology

## 2021-05-15 ENCOUNTER — Other Ambulatory Visit (HOSPITAL_COMMUNITY)
Admission: RE | Admit: 2021-05-15 | Discharge: 2021-05-15 | Disposition: A | Discharge status: Home / Self Care | Payer: No Typology Code available for payment source | Source: Ambulatory Visit | Attending: Obstetrics and Gynecology | Admitting: Obstetrics and Gynecology

## 2021-05-15 VITALS — BP 110/82 | HR 66 | Ht 62.25 in | Wt 217.0 lb

## 2021-05-15 DIAGNOSIS — Z01419 Encounter for gynecological examination (general) (routine) without abnormal findings: Secondary | ICD-10-CM

## 2021-05-15 DIAGNOSIS — R3915 Urgency of urination: Secondary | ICD-10-CM

## 2021-05-15 DIAGNOSIS — R635 Abnormal weight gain: Secondary | ICD-10-CM

## 2021-05-15 DIAGNOSIS — Z124 Encounter for screening for malignant neoplasm of cervix: Secondary | ICD-10-CM | POA: Insufficient documentation

## 2021-05-15 DIAGNOSIS — Z0000 Encounter for general adult medical examination without abnormal findings: Secondary | ICD-10-CM

## 2021-05-15 DIAGNOSIS — Z30431 Encounter for routine checking of intrauterine contraceptive device: Secondary | ICD-10-CM

## 2021-05-15 DIAGNOSIS — Z6839 Body mass index (BMI) 39.0-39.9, adult: Secondary | ICD-10-CM

## 2021-05-15 DIAGNOSIS — R7303 Prediabetes: Secondary | ICD-10-CM | POA: Diagnosis not present

## 2021-05-15 DIAGNOSIS — E559 Vitamin D deficiency, unspecified: Secondary | ICD-10-CM

## 2021-05-15 DIAGNOSIS — Z1231 Encounter for screening mammogram for malignant neoplasm of breast: Secondary | ICD-10-CM

## 2021-05-15 NOTE — Patient Instructions (Signed)

## 2021-05-16 ENCOUNTER — Telehealth: Payer: Self-pay

## 2021-05-16 LAB — CBC
HCT: 41.2 % (ref 35.0–45.0)
Hemoglobin: 13 g/dL (ref 11.7–15.5)
MCH: 27 pg (ref 27.0–33.0)
MCHC: 31.6 g/dL — ABNORMAL LOW (ref 32.0–36.0)
MCV: 85.7 fL (ref 80.0–100.0)
MPV: 10.5 fL (ref 7.5–12.5)
Platelets: 307 10*3/uL (ref 140–400)
RBC: 4.81 10*6/uL (ref 3.80–5.10)
RDW: 13.1 % (ref 11.0–15.0)
WBC: 6.2 10*3/uL (ref 3.8–10.8)

## 2021-05-16 LAB — COMPREHENSIVE METABOLIC PANEL
AG Ratio: 1.4 (calc) (ref 1.0–2.5)
ALT: 13 U/L (ref 6–29)
AST: 14 U/L (ref 10–35)
Albumin: 4.2 g/dL (ref 3.6–5.1)
Alkaline phosphatase (APISO): 56 U/L (ref 31–125)
BUN: 10 mg/dL (ref 7–25)
CO2: 25 mmol/L (ref 20–32)
Calcium: 9.6 mg/dL (ref 8.6–10.2)
Chloride: 106 mmol/L (ref 98–110)
Creat: 0.79 mg/dL (ref 0.50–0.99)
Globulin: 3.1 g/dL (calc) (ref 1.9–3.7)
Glucose, Bld: 96 mg/dL (ref 65–99)
Potassium: 5.1 mmol/L (ref 3.5–5.3)
Sodium: 141 mmol/L (ref 135–146)
Total Bilirubin: 0.6 mg/dL (ref 0.2–1.2)
Total Protein: 7.3 g/dL (ref 6.1–8.1)

## 2021-05-16 LAB — LIPID PANEL
Cholesterol: 141 mg/dL (ref <200)
HDL: 47 mg/dL — ABNORMAL LOW (ref >=50)
LDL Cholesterol (Calc): 75 mg/dL (calc)
Non-HDL Cholesterol (Calc): 94 mg/dL (calc) (ref <130)
Total CHOL/HDL Ratio: 3 (calc) (ref <5.0)
Triglycerides: 100 mg/dL (ref <150)

## 2021-05-16 LAB — HEMOGLOBIN A1C
Hgb A1c MFr Bld: 6.5 % of total Hgb — ABNORMAL HIGH (ref <5.7)
Mean Plasma Glucose: 140 mg/dL
eAG (mmol/L): 7.7 mmol/L

## 2021-05-16 LAB — TSH: TSH: 1.82 mIU/L

## 2021-05-16 LAB — VITAMIN D 25 HYDROXY (VIT D DEFICIENCY, FRACTURES): Vit D, 25-Hydroxy: 20 ng/mL — ABNORMAL LOW (ref 30–100)

## 2021-05-16 NOTE — Telephone Encounter (Signed)
Pt would like to establish care with [facility]. Pt was referred by two of wendling's pt's. Please advise.  ?

## 2021-05-17 LAB — URINALYSIS, COMPLETE W/RFL CULTURE
Bilirubin Urine: NEGATIVE
Casts: NONE SEEN /LPF
Crystals: NONE SEEN /HPF
Glucose, UA: NEGATIVE
Hyaline Cast: NONE SEEN /LPF
Ketones, ur: NEGATIVE
Leukocyte Esterase: NEGATIVE
Nitrites, Initial: NEGATIVE
Protein, ur: NEGATIVE
Specific Gravity, Urine: 1.023 (ref 1.001–1.035)
Yeast: NONE SEEN /HPF
pH: 5.5 (ref 5.0–8.0)

## 2021-05-17 LAB — URINE CULTURE
MICRO NUMBER:: 13284186
SPECIMEN QUALITY:: ADEQUATE

## 2021-05-17 LAB — CYTOLOGY - PAP
Comment: NEGATIVE
Diagnosis: NEGATIVE
Diagnosis: REACTIVE
High risk HPV: NEGATIVE

## 2021-05-17 LAB — CULTURE INDICATED

## 2021-05-20 ENCOUNTER — Ambulatory Visit
Admission: RE | Admit: 2021-05-20 | Discharge: 2021-05-20 | Disposition: A | Discharge status: Home / Self Care | Payer: No Typology Code available for payment source | Source: Ambulatory Visit | Attending: Obstetrics and Gynecology | Admitting: Obstetrics and Gynecology

## 2021-05-20 ENCOUNTER — Ambulatory Visit (AMBULATORY_SURGERY_CENTER): Payer: No Typology Code available for payment source | Admitting: Internal Medicine

## 2021-05-20 ENCOUNTER — Other Ambulatory Visit: Payer: Self-pay | Admitting: Obstetrics and Gynecology

## 2021-05-20 ENCOUNTER — Encounter: Payer: Self-pay | Admitting: Internal Medicine

## 2021-05-20 VITALS — BP 113/63 | HR 68 | Temp 97.7°F | Resp 12 | Ht 62.0 in | Wt 208.0 lb

## 2021-05-20 DIAGNOSIS — R3129 Other microscopic hematuria: Secondary | ICD-10-CM

## 2021-05-20 DIAGNOSIS — Z1231 Encounter for screening mammogram for malignant neoplasm of breast: Secondary | ICD-10-CM

## 2021-05-20 DIAGNOSIS — D124 Benign neoplasm of descending colon: Secondary | ICD-10-CM

## 2021-05-20 DIAGNOSIS — D125 Benign neoplasm of sigmoid colon: Secondary | ICD-10-CM | POA: Diagnosis not present

## 2021-05-20 DIAGNOSIS — Z1211 Encounter for screening for malignant neoplasm of colon: Secondary | ICD-10-CM | POA: Diagnosis present

## 2021-05-20 MED ORDER — SODIUM CHLORIDE 0.9 % IV SOLN: 500.0000 mL | Freq: Once | INTRAVENOUS | Status: DC

## 2021-05-20 NOTE — Progress Notes (Signed)
Pt's states no medical or surgical changes since previsit or office visit. 

## 2021-05-20 NOTE — Progress Notes (Signed)
[facility] History and Physical ? ? ?Primary Care Physician:  Patient, No Pcp Per (Inactive) ? ? ?Reason for Procedure:   Colon cancer screening ? ?Plan:    colonoscopy ? ? ? ? ?HPI: Barbara Thornton is a 46 y.o. female here for colon cancer screen ? ? ?Past Medical History:  ?Diagnosis Date  ? Anemia   ? Anxiety   ? DVT (deep venous thrombosis) ([place]) 01/07/2016  ? right leg, from BCP's then blood thinners used, now IUD to prevent clotting  ? ? ?Past Surgical History:  ?Procedure Laterality Date  ? [place]  ? epidural  ? INTRAUTERINE DEVICE INSERTION  01/29/2016  ? Mirena  ? TUBAL LIGATION  1999  ? ? ?Prior to Admission medications   ?Medication Sig Start Date End Date Taking? Authorizing Provider  ?levonorgestrel (MIRENA) 20 MCG/24HR IUD 1 each by Intrauterine route once.   Yes [provider]  ?ondansetron (ZOFRAN) 4 MG tablet Take 1 tablet (4 mg total) by mouth as directed for 2 doses. Take one Zofran 4 mg tablet 30-60 minutes before each prep dose 04/29/21  Yes Gatha Mayer, MD  ? ? ?Current Outpatient Medications  ?Medication Sig Dispense Refill  ? levonorgestrel (MIRENA) 20 MCG/24HR IUD 1 each by Intrauterine route once.    ? ondansetron (ZOFRAN) 4 MG tablet Take 1 tablet (4 mg total) by mouth as directed for 2 doses. Take one Zofran 4 mg tablet 30-60 minutes before each prep dose 2 tablet 0  ? ?Current Facility-Administered Medications  ?Medication Dose Route Frequency Provider Last Rate Last Admin  ? 0.9 %  sodium chloride infusion  500 mL Intravenous Once Gatha Mayer, MD      ? ? ?Allergies as of 05/20/2021  ? (No Known Allergies)  ? ? ?Family History  ?Problem Relation Age of Onset  ? Hypertension Mother   ? Breast cancer Maternal Aunt   ?     unsure of age  ? Breast cancer Cousin   ?     unsure of age  ? Colon cancer Neg Hx   ? Colon polyps Neg Hx   ? Esophageal cancer Neg Hx   ? Rectal cancer Neg Hx   ? Stomach cancer Neg Hx   ? ? ?Social History   ? ?Socioeconomic History  ? Marital status: Significant Other  ?  Spouse name: Not on file  ? Number of children: Not on file  ? Years of education: Not on file  ? Highest education level: Not on file  ?Occupational History  ? Not on file  ?Tobacco Use  ? Smoking status: Former  ?  Packs/day: 0.50  ?  Years: 17.00  ?  Pack years: 8.50  ?  Types: Cigarettes  ?  Quit date: 11/27/2012  ?  Years since quitting: 8.4  ? Smokeless tobacco: Never  ?Vaping Use  ? Vaping Use: Never used  ?Substance and Sexual Activity  ? Alcohol use: Yes  ?  Comment: very occasional, glass of wine 2-3 times per year  ? Drug use: Never  ? Sexual activity: Not Currently  ?  Partners: Male  ?  Birth control/protection: I.U.D., Surgical  ? ? ? ?Review of Systems: ? ?All other review of systems negative except as mentioned in the HPI. ? ?Physical Exam: ?Vital signs ?BP (!) 151/77   Pulse 69   Temp 97.7 ?F (36.5 ?C) (Temporal)   Ht '5\' 2"'$  (1.575 m)   Wt 208 lb (94.3  kg)   LMP 04/23/2021 Comment: light periods  SpO2 100%   BMI 38.04 kg/m?  ? ?General:   Alert,  Well-developed, well-nourished, pleasant and cooperative in NAD ?Lungs:  Clear throughout to auscultation.   ?Heart:  Regular rate and rhythm; no murmurs, clicks, rubs,  or gallops. ?Abdomen:  Soft, nontender and nondistended. Normal bowel sounds.   ?Neuro/Psych:  Alert and cooperative. Normal mood and affect. A and O x 3 ? ? ?'@Adali Pennings'$  Simonne Maffucci, MD, [facility] ?[facility] ?[phone] (pager) ?05/20/2021 2:12 PM@ ? ?

## 2021-05-20 NOTE — Patient Instructions (Addendum)
I found and removed 2 tiny polyps that look benign. ?I will let you know pathology results and when to have another routine colonoscopy by mail and/or My Chart. ? ?I appreciate the opportunity to care for you. ?Gatha Mayer, MD, [facility] ? ? ? ?YOU HAD AN ENDOSCOPIC PROCEDURE TODAY AT [facility]:   Refer to the procedure report that was given to you for any specific questions about what was found during the examination.  If the procedure report does not answer your questions, please call your gastroenterologist to clarify.  If you requested that your care partner not be given the details of your procedure findings, then the procedure report has been included in a sealed envelope for you to review at your convenience later. ? ?YOU SHOULD EXPECT: Some feelings of bloating in the abdomen. Passage of more gas than usual.  Walking can help get rid of the air that was put into your GI tract during the procedure and reduce the bloating. If you had a lower endoscopy (such as a colonoscopy or flexible sigmoidoscopy) you may notice spotting of blood in your stool or on the toilet paper. If you underwent a bowel prep for your procedure, you may not have a normal bowel movement for a few days. ? ?Please Note:  You might notice some irritation and congestion in your nose or some drainage.  This is from the oxygen used during your procedure.  There is no need for concern and it should clear up in a day or so. ? ?SYMPTOMS TO REPORT IMMEDIATELY: ? ?Following lower endoscopy (colonoscopy or flexible sigmoidoscopy): ? Excessive amounts of blood in the stool ? Significant tenderness or worsening of abdominal pains ? Swelling of the abdomen that is new, acute ? Fever of 100?F or higher ? ? ?For urgent or emergent issues, a gastroenterologist can be reached at any hour by calling [phone]. ?Do not use MyChart messaging for urgent concerns.  ? ? ?DIET:  We do recommend a small meal at first, but then you may  proceed to your regular diet.  Drink plenty of fluids but you should avoid alcoholic beverages for 24 hours. ? ?ACTIVITY:  You should plan to take it easy for the rest of today and you should NOT DRIVE or use heavy machinery until tomorrow (because of the sedation medicines used during the test).   ? ?FOLLOW UP: ?Our staff will call the number listed on your records 48-72 hours following your procedure to check on you and address any questions or concerns that you may have regarding the information given to you following your procedure. If we do not reach you, we will leave a message.  We will attempt to reach you two times.  During this call, we will ask if you have developed any symptoms of COVID 19. If you develop any symptoms (ie: fever, flu-like symptoms, shortness of breath, cough etc.) before then, please call [phone].  If you test positive for Covid 19 in the 2 weeks post procedure, please call and report this information to [place].   ? ?If any biopsies were taken you will be contacted by phone or by letter within the next 1-3 weeks.  Please call us at [phone] if you have not heard about the biopsies in 3 weeks.  ? ? ?SIGNATURES/CONFIDENTIALITY: ?You and/or your care partner have signed paperwork which will be entered into your electronic medical record.  These signatures attest to the fact that that the information above  on your After Visit Summary has been reviewed and is understood.  Full responsibility of the confidentiality of this discharge information lies with you and/or your care-partner.  ?

## 2021-05-20 NOTE — Progress Notes (Signed)
PT taken to PACU. Monitors in place. VSS. Report given to RN. 

## 2021-05-20 NOTE — Op Note (Signed)
[place] ?Patient Name: Barbara Thornton ?Procedure Date: 05/20/2021 2:06 PM ?MRN: [id number] ?Endoscopist: Gatha Mayer , MD ?Age: 46 ?Referring MD:  ?Date of Birth: 1975-08-21 ?Gender: Female ?Account #: [id number] ?Procedure:                Colonoscopy ?Indications:              Screening for colorectal malignant neoplasm, This  ?                          is the patient's first colonoscopy ?Medicines:                Monitored Anesthesia Care ?Procedure:                Pre-Anesthesia Assessment: ?                          - Prior to the procedure, a History and Physical  ?                          was performed, and patient medications and  ?                          allergies were reviewed. The patient's tolerance of  ?                          previous anesthesia was also reviewed. The risks  ?                          and benefits of the procedure and the sedation  ?                          options and risks were discussed with the patient.  ?                          All questions were answered, and informed consent  ?                          was obtained. Prior Anticoagulants: The patient has  ?                          taken no previous anticoagulant or antiplatelet  ?                          agents. ASA Grade Assessment: II - A patient with  ?                          mild systemic disease. After reviewing the risks  ?                          and benefits, the patient was deemed in  ?                          satisfactory condition to undergo the procedure. ?  After obtaining informed consent, the colonoscope  ?                          was passed under direct vision. Throughout the  ?                          procedure, the patient's blood pressure, pulse, and  ?                          oxygen saturations were monitored continuously. The  ?                          CF [id number] #[id number] was introduced through the anus  ?                          and advanced to the the  cecum, identified by  ?                          appendiceal orifice and ileocecal valve. The  ?                          colonoscopy was performed without difficulty. The  ?                          patient tolerated the procedure well. The quality  ?                          of the bowel preparation was good. The bowel  ?                          preparation used was Miralax via split dose  ?                          instruction. The ileocecal valve, appendiceal  ?                          orifice, and rectum were photographed. ?Scope In: 2:21:09 PM ?Scope Out: 2:35:51 PM ?Scope Withdrawal Time: 0 hours 10 minutes 53 seconds  ?Total Procedure Duration: 0 hours 14 minutes 42 seconds  ?Findings:                 The perianal and digital rectal examinations were  ?                          normal. ?                          Two sessile polyps were found in the sigmoid colon  ?                          and descending colon. The polyps were 1 to 2 mm in  ?                          size. These polyps were removed with a cold biopsy  ?  forceps. Resection and retrieval were complete.  ?                          Verification of patient identification for the  ?                          specimen was done. Estimated blood loss was minimal. ?                          The exam was otherwise without abnormality on  ?                          direct and retroflexion views. ?Complications:            No immediate complications. ?Estimated Blood Loss:     Estimated blood loss was minimal. ?Impression:               - Two 1 to 2 mm polyps in the sigmoid colon and in  ?                          the descending colon, removed with a cold biopsy  ?                          forceps. Resected and retrieved. ?                          - The examination was otherwise normal on direct  ?                          and retroflexion views. ?Recommendation:           - Patient has a contact number available for  ?                           emergencies. The signs and symptoms of potential  ?                          delayed complications were discussed with the  ?                          patient. Return to normal activities tomorrow.  ?                          Written discharge instructions were provided to the  ?                          patient. ?                          - Resume previous diet. ?                          - Continue present medications. ?                          - Await pathology results. ?                          -  Repeat colonoscopy is recommended. The  ?                          colonoscopy date will be determined after pathology  ?                          results from today's exam become available for  ?                          review. ?Gatha Mayer, MD ?05/20/2021 2:40:36 PM ?This report has been signed electronically. ?

## 2021-05-20 NOTE — Progress Notes (Signed)
Called to room to assist during endoscopic procedure.  Patient ID and intended procedure confirmed with present staff. Received instructions for my participation in the procedure from the performing physician.  

## 2021-05-21 NOTE — Telephone Encounter (Signed)
Please get patient scheduled.  °

## 2021-05-21 NOTE — Telephone Encounter (Signed)
Pt scheduled for 06/04/21 ?

## 2021-05-22 ENCOUNTER — Telehealth: Payer: Self-pay | Admitting: *Deleted

## 2021-05-22 ENCOUNTER — Telehealth: Payer: Self-pay

## 2021-05-22 NOTE — Telephone Encounter (Signed)
No answer on second attempt follow up call.  ? ?

## 2021-05-22 NOTE — Telephone Encounter (Signed)
Follow up call placed, VM obtained and message left. ?SChaplin, RN,BSN ? ?

## 2021-05-23 ENCOUNTER — Encounter: Payer: Self-pay | Admitting: Internal Medicine

## 2021-05-23 DIAGNOSIS — Z860101 Personal history of adenomatous and serrated colon polyps: Secondary | ICD-10-CM

## 2021-05-23 DIAGNOSIS — Z86010 Personal history of colonic polyps: Secondary | ICD-10-CM

## 2021-05-23 HISTORY — DX: Personal history of colonic polyps: Z86.010

## 2021-05-23 HISTORY — DX: Personal history of adenomatous and serrated colon polyps: Z86.0101

## 2021-06-04 ENCOUNTER — Encounter: Payer: Self-pay | Admitting: Family Medicine

## 2021-06-04 ENCOUNTER — Ambulatory Visit (INDEPENDENT_AMBULATORY_CARE_PROVIDER_SITE_OTHER): Payer: No Typology Code available for payment source | Admitting: Family Medicine

## 2021-06-04 VITALS — BP 139/82 | HR 93 | Temp 99.3°F | Ht 62.0 in | Wt 216.1 lb

## 2021-06-04 DIAGNOSIS — R3129 Other microscopic hematuria: Secondary | ICD-10-CM

## 2021-06-04 DIAGNOSIS — E1165 Type 2 diabetes mellitus with hyperglycemia: Secondary | ICD-10-CM | POA: Insufficient documentation

## 2021-06-04 DIAGNOSIS — Z23 Encounter for immunization: Secondary | ICD-10-CM

## 2021-06-04 MED ORDER — ROSUVASTATIN CALCIUM 20 MG PO TABS: 20.0000 mg | ORAL_TABLET | Freq: Every day | ORAL | 3 refills | Status: DC

## 2021-06-04 NOTE — Patient Instructions (Addendum)
Give [place] 2-3 business days to get the results of your labs back.  ? ?Call your insurance company regarding what glucose meter they will cover. Let me know what brand they cover and we will send it in. Don't worry about checking your sugars for now.  ? ?Let us know if you need anything. ? ?Healthy Eating Plan ?Many factors influence your heart health, including eating and exercise habits. Heart (coronary) risk increases with abnormal blood fat (lipid) levels. Heart-healthy meal planning includes limiting unhealthy fats, increasing healthy fats, and making other small dietary changes. This includes maintaining a healthy body weight to help keep lipid levels within a normal range. ? ?WHAT IS MY PLAN?  ?Your health care provider recommends that you: ?Drink a glass of water before meals to help with satiety. ?Eat slowly. ?An alternative to the water is to add Metamucil. This will help with satiety as well. It does contain calories, unlike water. ? ?WHAT TYPES OF FAT SHOULD I CHOOSE? ?Choose healthy fats more often. Choose monounsaturated and polyunsaturated fats, such as olive oil and canola oil, flaxseeds, walnuts, almonds, and seeds. ?Eat more omega-3 fats. Good choices include salmon, mackerel, sardines, tuna, flaxseed oil, and ground flaxseeds. Aim to eat fish at least two times each week. ?Avoid foods with partially hydrogenated oils in them. These contain trans fats. Examples of foods that contain trans fats are stick margarine, some tub margarines, cookies, crackers, and other baked goods. If you are going to avoid a fat, this is the one to avoid! ? ?WHAT GENERAL GUIDELINES DO I NEED TO FOLLOW? ?Check food labels carefully to identify foods with trans fats. Avoid these types of options when possible. ?Fill one half of your plate with vegetables and green salads. Eat 4-5 servings of vegetables per day. A serving of vegetables equals 1 cup of raw leafy vegetables, ? cup of raw or cooked cut-up vegetables, or ? cup of  vegetable juice. ?Fill one fourth of your plate with whole grains. Look for the word "whole" as the first word in the ingredient list. ?Fill one fourth of your plate with lean protein foods. ?Eat 4-5 servings of fruit per day. A serving of fruit equals one medium whole fruit, ? cup of dried fruit, ? cup of fresh, frozen, or canned fruit. Try to avoid fruits in cups/syrups as the sugar content can be high. ?Eat more foods that contain soluble fiber. Examples of foods that contain this type of fiber are apples, broccoli, carrots, beans, peas, and barley. Aim to get 20-30 g of fiber per day. ?Eat more home-cooked food and less restaurant, buffet, and fast food. ?Limit or avoid alcohol. ?Limit foods that are high in starch and sugar. ?Avoid fried foods when able. ?Cook foods by using methods other than frying. Baking, boiling, grilling, and broiling are all great options. Other fat-reducing suggestions include: ?Removing the skin from poultry. ?Removing all visible fats from meats. ?Skimming the fat off of stews, soups, and gravies before serving them. ?Steaming vegetables in water or broth. ?Lose weight if you are overweight. Losing just 5-10% of your initial body weight can help your overall health and prevent diseases such as diabetes and heart disease. ?Increase your consumption of nuts, legumes, and seeds to 4-5 servings per week. One serving of dried beans or legumes equals ? cup after being cooked, one serving of nuts equals 1? ounces, and one serving of seeds equals ? ounce or 1 tablespoon. ? ?WHAT ARE GOOD FOODS CAN I EAT? ?Grains ?Grainy  breads (try to find bread that is 3 g of fiber per slice or greater), oatmeal, light popcorn. Whole-grain cereals. Rice and pasta, including brown rice and those that are made with whole wheat. Edamame pasta is a great alternative to grain pasta. It has a higher protein content. Try to avoid significant consumption of white bread, sugary cereals, or pastries/baked  goods. ? ?Vegetables ?All vegetables. Cooked white potatoes do not count as vegetables. ? ?Fruits ?All fruits, but limit pineapple and bananas as these fruits have a higher sugar content. ? ?Meats and Other Protein Sources ?Lean, well-trimmed beef, veal, pork, and lamb. Chicken and [place] without skin. All fish and shellfish. Wild duck, rabbit, pheasant, and venison. Egg whites or low-cholesterol egg substitutes. Dried beans, peas, lentils, and tofu. Seeds and most nuts. ? ?Dairy ?Low-fat or nonfat cheeses, including ricotta, string, and mozzarella. Skim or 1% milk that is liquid, powdered, or evaporated. Buttermilk that is made with low-fat milk. Nonfat or low-fat yogurt. Soy/Almond milk are good alternatives if you cannot handle dairy. ? ?Beverages ?Water is the best for you. Sports drinks with less sugar are more desirable unless you are a highly active athlete. ? ?Sweets and Desserts ?Sherbets and fruit ices. Honey, jam, marmalade, jelly, and syrups. Dark chocolate.  ?Eat all sweets and desserts in moderation. ? ?Fats and Oils ?Nonhydrogenated (trans-free) margarines. Vegetable oils, including soybean, sesame, sunflower, olive, peanut, safflower, corn, canola, and cottonseed. Salad dressings or mayonnaise that are made with a vegetable oil. Limit added fats and oils that you use for cooking, baking, salads, and as spreads. ? ?Other ?Cocoa powder. Coffee and tea. Most condiments. ? ?The items listed above may not be a complete list of recommended foods or beverages. Contact your dietitian for more options. ?

## 2021-06-04 NOTE — Progress Notes (Signed)
Chief Complaint  ?Patient presents with  ? New Patient (Initial Visit)  ? ? ?   New Patient Visit ?SUBJECTIVE: ?HPI: Barbara Thornton is an 46 y.o.female who is being seen for establishing care. ? ?The patient was previously seen at only her gynecologist. ? ?Patient recently had her yearly checkup with her gynecologist.  A1c was 6.5, she had a few red blood cells in her urine screen as well.  She has not noticed any blood in her urine.  She has never had a pneumonia vaccine.  She is not on a statin or any other oral medications.  She does have a family history of diabetes, mainly on her father's side.  She follows with an eye doctor yearly, usually around June.  She does not check her sugars routinely.  Diet is improving.  She does not exercise routinely. ? ?Past Medical History:  ?Diagnosis Date  ? Anemia   ? DVT (deep venous thrombosis) ([place]) 01/07/2016  ? right leg, from BCP's then blood thinners used, now IUD to prevent clotting  ? Hx of adenomatous colonic polyps 05/23/2021  ? 2 diminutive adenomas-recall 2030  ? ?Past Surgical History:  ?Procedure Laterality Date  ? [place]  ? epidural  ? INTRAUTERINE DEVICE INSERTION  01/29/2016  ? Mirena  ? TUBAL LIGATION  1999  ? ?Family History  ?Problem Relation Age of Onset  ? Hypertension Mother   ? Diabetes Mother   ? Breast cancer Maternal Aunt   ?     unsure of age  ? Breast cancer Cousin   ?     unsure of age  ? Colon cancer Neg Hx   ? Colon polyps Neg Hx   ? Esophageal cancer Neg Hx   ? Rectal cancer Neg Hx   ? Stomach cancer Neg Hx   ? ?No Known Allergies ? ?Current Outpatient Medications:  ?  levonorgestrel (MIRENA) 20 MCG/24HR IUD, 1 each by Intrauterine route once., Disp: , Rfl:  ?  rosuvastatin (CRESTOR) 20 MG tablet, Take 1 tablet (20 mg total) by mouth daily., Disp: 30 tablet, Rfl: 3 ? ?OBJECTIVE: ?BP 139/82   Pulse 93   Temp 99.3 ?F (37.4 ?C) (Oral)   Ht '5\' 2"'$  (1.575 m)   Wt 216 lb 2 oz (98 kg)   SpO2 98%   BMI 39.53 kg/m?  ?General:   well developed, well nourished, in no apparent distress ?Skin: No skin lesions on the feet noted bilaterally ?Nose:  nares patent, septum midline, mucosa normal ?Throat/Pharynx:  lips and gingiva without lesion; tongue and uvula midline; non-inflamed pharynx; no exudates or postnasal drainage ?Lungs:  clear to auscultation, breath sounds equal bilaterally, no respiratory distress ?Cardio:  regular rate and rhythm, no LE edema or bruits, DP pulses 2+ bilaterally ?Musculoskeletal:  symmetrical muscle groups noted without atrophy or deformity ?Neuro:  gait normal; sensation intact to pinprick bilaterally over the feet ?Psych: well oriented with normal range of affect and appropriate judgment/insight ? ?ASSESSMENT/PLAN: ?Type 2 diabetes mellitus with hyperglycemia, without long-term current use of insulin (HCC) - Plan: Microalbumin / creatinine urine ratio, rosuvastatin (CRESTOR) 20 MG tablet, Lipid panel, Hepatic function panel ? ?Microscopic hematuria - Plan: Urine Microscopic Only ? ?Need for pneumococcal 20-valent conjugate vaccination - Plan: Pneumococcal conjugate vaccine 20-valent (Prevnar 20) ? ? ?1. Chronic issue, not ideally managed at this time.  Start Crestor 20 mg daily.  Recheck in 6 weeks Labs as above.  Check microalbumin creatinine ratio today.  Counseled on  diet and exercise.  She does not need an oral hypoglycemic at this time. ?2.  Newer problem to me, uncertain prognosis.  Recheck urine microscopy.  If unremarkable, will refer to urology. ?I will see her in 6 months for physical unless needed. ?The patient voiced understanding and agreement to the plan. ? ? ?[place], DO ?06/04/21  ?3:27 PM ? ?

## 2021-06-05 LAB — URINALYSIS, MICROSCOPIC ONLY: RBC / HPF: NONE SEEN (ref 0–?)

## 2021-06-05 LAB — MICROALBUMIN / CREATININE URINE RATIO
Creatinine,U: 264.7 mg/dL
Microalb Creat Ratio: 0.5 mg/g (ref 0.0–30.0)
Microalb, Ur: 1.5 mg/dL (ref 0.0–1.9)

## 2021-07-16 ENCOUNTER — Other Ambulatory Visit (INDEPENDENT_AMBULATORY_CARE_PROVIDER_SITE_OTHER): Payer: No Typology Code available for payment source

## 2021-07-16 DIAGNOSIS — E1165 Type 2 diabetes mellitus with hyperglycemia: Secondary | ICD-10-CM

## 2021-07-16 LAB — LIPID PANEL
Cholesterol: 81 mg/dL (ref 0–200)
HDL: 39.3 mg/dL (ref >39.00)
LDL Cholesterol: 28 mg/dL (ref 0–99)
NonHDL: 41.5
Total CHOL/HDL Ratio: 2
Triglycerides: 70 mg/dL (ref 0.0–149.0)
VLDL: 14 mg/dL (ref 0.0–40.0)

## 2021-07-16 LAB — HEPATIC FUNCTION PANEL
ALT: 13 U/L (ref 0–35)
AST: 12 U/L (ref 0–37)
Albumin: 4.1 g/dL (ref 3.5–5.2)
Alkaline Phosphatase: 60 U/L (ref 39–117)
Bilirubin, Direct: 0.1 mg/dL (ref 0.0–0.3)
Total Bilirubin: 0.5 mg/dL (ref 0.2–1.2)
Total Protein: 7.3 g/dL (ref 6.0–8.3)

## 2021-08-08 ENCOUNTER — Other Ambulatory Visit: Payer: Self-pay | Admitting: Family Medicine

## 2021-08-08 DIAGNOSIS — E1165 Type 2 diabetes mellitus with hyperglycemia: Secondary | ICD-10-CM

## 2021-11-12 ENCOUNTER — Other Ambulatory Visit: Payer: Self-pay | Admitting: Family Medicine

## 2021-11-12 DIAGNOSIS — E1165 Type 2 diabetes mellitus with hyperglycemia: Secondary | ICD-10-CM

## 2021-12-03 ENCOUNTER — Ambulatory Visit (INDEPENDENT_AMBULATORY_CARE_PROVIDER_SITE_OTHER): Payer: No Typology Code available for payment source | Admitting: Family Medicine

## 2021-12-03 ENCOUNTER — Encounter: Payer: Self-pay | Admitting: Family Medicine

## 2021-12-03 VITALS — BP 132/80 | HR 78 | Temp 98.8°F | Ht 62.0 in | Wt 218.5 lb

## 2021-12-03 DIAGNOSIS — E1165 Type 2 diabetes mellitus with hyperglycemia: Secondary | ICD-10-CM | POA: Diagnosis not present

## 2021-12-03 DIAGNOSIS — Z0000 Encounter for general adult medical examination without abnormal findings: Secondary | ICD-10-CM

## 2021-12-03 LAB — CBC
HCT: 39.6 % (ref 36.0–46.0)
Hemoglobin: 12.6 g/dL (ref 12.0–15.0)
MCHC: 31.8 g/dL (ref 30.0–36.0)
MCV: 84.4 fl (ref 78.0–100.0)
Platelets: 309 10*3/uL (ref 150.0–400.0)
RBC: 4.7 Mil/uL (ref 3.87–5.11)
RDW: 14.1 % (ref 11.5–15.5)
WBC: 5.3 10*3/uL (ref 4.0–10.5)

## 2021-12-03 LAB — COMPREHENSIVE METABOLIC PANEL
ALT: 14 U/L (ref 0–35)
AST: 15 U/L (ref 0–37)
Albumin: 4.1 g/dL (ref 3.5–5.2)
Alkaline Phosphatase: 58 U/L (ref 39–117)
BUN: 9 mg/dL (ref 6–23)
CO2: 30 mEq/L (ref 19–32)
Calcium: 9.6 mg/dL (ref 8.4–10.5)
Chloride: 103 mEq/L (ref 96–112)
Creatinine, Ser: 0.72 mg/dL (ref 0.40–1.20)
GFR: 100.47 mL/min (ref >60.00)
Glucose, Bld: 108 mg/dL — ABNORMAL HIGH (ref 70–99)
Potassium: 4.8 mEq/L (ref 3.5–5.1)
Sodium: 139 mEq/L (ref 135–145)
Total Bilirubin: 0.6 mg/dL (ref 0.2–1.2)
Total Protein: 7.2 g/dL (ref 6.0–8.3)

## 2021-12-03 LAB — LIPID PANEL
Cholesterol: 153 mg/dL (ref 0–200)
HDL: 47.8 mg/dL (ref >39.00)
LDL Cholesterol: 91 mg/dL (ref 0–99)
NonHDL: 105.28
Total CHOL/HDL Ratio: 3
Triglycerides: 69 mg/dL (ref 0.0–149.0)
VLDL: 13.8 mg/dL (ref 0.0–40.0)

## 2021-12-03 LAB — HEMOGLOBIN A1C: Hgb A1c MFr Bld: 6.8 % — ABNORMAL HIGH (ref 4.6–6.5)

## 2021-12-03 NOTE — Progress Notes (Signed)
Chief Complaint  Patient presents with   Annual Exam    Will get her flu shot at [facility]     Well Woman Barbara Thornton is here for a complete physical.   Her last physical was >1 year ago.  Current diet: in general, a "healthy" diet. Current exercise: walking. Weight is stable and she denies fatigue out of ordinary. No LMP recorded. (Menstrual status: IUD). Seatbelt? Yes Advanced directive? No  Health Maintenance Pap/HPV- Yes Mammogram- Yes Tetanus- Yes Hep C screening- Yes HIV screening- Yes  Past Medical History:  Diagnosis Date   Anemia    DVT (deep venous thrombosis) ([place]) 01/07/2016   right leg, from BCP's then blood thinners used, now IUD to prevent clotting   Hx of adenomatous colonic polyps 05/23/2021   2 diminutive adenomas-recall 2030     Past Surgical History:  Procedure Laterality Date   CESAREAN SECTION  1999   epidural   INTRAUTERINE DEVICE INSERTION  01/29/2016   Mirena   TUBAL LIGATION  1999    Medications  Current Outpatient Medications on File Prior to Visit  Medication Sig Dispense Refill   levonorgestrel (MIRENA) 20 MCG/24HR IUD 1 each by Intrauterine route once.     rosuvastatin (CRESTOR) 20 MG tablet TAKE 1 TABLET BY MOUTH EVERY DAY 90 tablet 1   No current facility-administered medications on file prior to visit.     Allergies No Known Allergies  Review of Systems: Constitutional:  no unexpected weight changes Eye:  no recent significant change in vision Ear/Nose/Mouth/Throat:  Ears:  no recent change in hearing Nose/Mouth/Throat:  no complaints of nasal congestion, no sore throat Cardiovascular: no chest pain Respiratory:  no shortness of breath Gastrointestinal:  no abdominal pain, no change in bowel habits GU:  Female: negative for dysuria or pelvic pain Musculoskeletal/Extremities:  no pain of the joints Integumentary (Skin/Breast):  no abnormal skin lesions reported Neurologic:  no headaches Endocrine:  denies  fatigue Hematologic/Lymphatic:  No areas of easy bleeding  Exam BP 132/80 (BP Location: Left Arm, Patient Position: Sitting, Cuff Size: Large)   Pulse 78   Temp 98.8 F (37.1 C) (Oral)   Ht '5\' 2"'$  (1.575 m)   Wt 218 lb 8 oz (99.1 kg)   SpO2 95%   BMI 39.96 kg/m  General:  well developed, well nourished, in no apparent distress Skin:  no significant moles, warts, or growths Head:  no masses, lesions, or tenderness Eyes:  pupils equal and round, sclera anicteric without injection Ears:  canals without lesions, TMs shiny without retraction, no obvious effusion, no erythema Nose:  nares patent, mucosa normal, and no drainage Throat/Pharynx:  lips and gingiva without lesion; tongue and uvula midline; non-inflamed pharynx; no exudates or postnasal drainage Neck: neck supple without adenopathy, thyromegaly, or masses Lungs:  clear to auscultation, breath sounds equal bilaterally, no respiratory distress Cardio:  regular rate and rhythm, no LE edema Abdomen:  abdomen soft, nontender; bowel sounds normal; no masses or organomegaly Genital: Defer to GYN Musculoskeletal:  symmetrical muscle groups noted without atrophy or deformity Extremities:  no clubbing, cyanosis, or edema, no deformities, no skin discoloration Neuro:  gait normal; deep tendon reflexes normal and symmetric Psych: well oriented with normal range of affect and appropriate judgment/insight  Assessment and Plan  Well adult exam - Plan: CBC, Comprehensive metabolic panel, Lipid panel  Type 2 diabetes mellitus with hyperglycemia, without long-term current use of insulin (HCC) - Plan: Hemoglobin A1c   Well 46 y.o. female. Counseled on  diet and exercise. Advanced directive form provided today.  She will get her flu shot thru work.  Other orders as above. Follow up in 6 mo. The patient voiced understanding and agreement to the plan.  [place], DO 12/03/21 10:29 AM

## 2021-12-03 NOTE — Patient Instructions (Signed)
Give us 2-3 business days to get the results of your labs back.   Keep the diet clean and stay active.  Please get me a copy of your advanced directive form at your convenience.   Let us know if you need anything.  

## 2021-12-17 ENCOUNTER — Encounter: Payer: Self-pay | Admitting: Family Medicine

## 2021-12-17 ENCOUNTER — Other Ambulatory Visit: Payer: Self-pay | Admitting: Family Medicine

## 2021-12-17 DIAGNOSIS — N62 Hypertrophy of breast: Secondary | ICD-10-CM

## 2022-01-17 ENCOUNTER — Encounter: Payer: Self-pay | Admitting: Plastic Surgery

## 2022-01-17 ENCOUNTER — Ambulatory Visit (INDEPENDENT_AMBULATORY_CARE_PROVIDER_SITE_OTHER): Payer: No Typology Code available for payment source | Admitting: Plastic Surgery

## 2022-01-17 VITALS — BP 141/90 | HR 78 | Ht 62.0 in | Wt 216.8 lb

## 2022-01-17 DIAGNOSIS — M542 Cervicalgia: Secondary | ICD-10-CM

## 2022-01-17 DIAGNOSIS — M545 Low back pain, unspecified: Secondary | ICD-10-CM | POA: Diagnosis not present

## 2022-01-17 DIAGNOSIS — N62 Hypertrophy of breast: Secondary | ICD-10-CM

## 2022-01-17 DIAGNOSIS — M546 Pain in thoracic spine: Secondary | ICD-10-CM

## 2022-01-17 DIAGNOSIS — R21 Rash and other nonspecific skin eruption: Secondary | ICD-10-CM

## 2022-01-17 DIAGNOSIS — Z6839 Body mass index (BMI) 39.0-39.9, adult: Secondary | ICD-10-CM

## 2022-01-17 NOTE — Progress Notes (Signed)
Patient ID: Barbara Thornton, female    DOB: 10/07/1975, 46 y.o.   MRN: [id number]   Chief Complaint  Patient presents with   Advice Only   Breast Problem    Mammary Hyperplasia: The patient is a 46 y.o. female with a history of mammary hyperplasia for several years.  She has extremely large breasts causing symptoms that include the following: Back pain in the upper and lower back, including neck pain. She pulls or pins her bra straps to provide better lift and relief of the pressure and pain. She notices relief by holding her breast up manually.  Her shoulder straps cause grooves and pain and pressure that requires padding for relief. Pain medication is sometimes required with motrin and tylenol.  Activities that are hindered by enlarged breasts include: exercise and running.  She has tried supportive clothing as well as fitted bras without improvement.  Her breasts are extremely large and fairly symmetric.  She has hyperpigmentation of the inframammary area on both sides.  The sternal to nipple distance on the right is 36 cm and the left is 37 cm.  The IMF distance is 17 cm.  She is 5 feet 2 inches tall and weighs 216 pounds.  The BMI = 39.5 kg/m.  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 She would like to be a C cup.  The estimated excess breast tissue to be removed at the time of surgery = she would need to have a between 6 and 700 g removed from the breast in order to be approved for surgery from both sides.  Mammogram history: April 2023 and it was normal.  Family history of breast cancer: None.  Tobacco use: None .   The patient expresses the desire to pursue surgical intervention. The patient would like to try to get to a better weight before surgery.  I think that that is a great idea and we completely support her with that.    Review of Systems  Constitutional:  Positive for activity change. Negative for appetite change.  Eyes: Negative.   Respiratory: Negative.  Negative for chest  tightness and shortness of breath.   Cardiovascular: Negative.   Gastrointestinal: Negative.   Endocrine: Negative.   Genitourinary: Negative.   Musculoskeletal:  Positive for back pain and neck pain.  Skin:  Positive for rash.  Hematological: Negative.     Past Medical History:  Diagnosis Date   Anemia    DVT (deep venous thrombosis) ([address]) 01/07/2016   right leg, from BCP's then blood thinners used, now IUD to prevent clotting   Hx of adenomatous colonic polyps 05/23/2021   2 diminutive adenomas-recall 2030    Past Surgical History:  Procedure Laterality Date   CESAREAN SECTION  1999   epidural   INTRAUTERINE DEVICE INSERTION  01/29/2016   Mirena   TUBAL LIGATION  1999      Current Outpatient Medications:    levonorgestrel (MIRENA) 20 MCG/24HR IUD, 1 each by Intrauterine route once., Disp: , Rfl:    rosuvastatin (CRESTOR) 20 MG tablet, TAKE 1 TABLET BY MOUTH EVERY DAY, Disp: 90 tablet, Rfl: 1   Objective:   Vitals:   01/17/22 1030  BP: (!) 141/90  Pulse: 78  SpO2: 98%    Physical Exam Vitals reviewed.  Constitutional:      Appearance: Normal appearance.  HENT:     Head: Normocephalic and atraumatic.  Cardiovascular:     Rate and Rhythm: Normal rate.     Pulses:  Normal pulses.  Pulmonary:     Effort: Pulmonary effort is normal.  Abdominal:     General: There is no distension.     Palpations: Abdomen is soft.     Tenderness: There is no abdominal tenderness.  Musculoskeletal:        General: No swelling.  Skin:    Capillary Refill: Capillary refill takes less than 2 seconds.     Coloration: Skin is not jaundiced or pale.     Findings: Erythema present. No bruising or lesion.  Neurological:     Mental Status: She is oriented to person, place, and time.  Psychiatric:        Mood and Affect: Mood normal.        Behavior: Behavior normal.        Thought Content: Thought content normal.        Judgment: Judgment normal.     Assessment & Plan:   Symptomatic mammary hypertrophy  The procedure the patient selected and that was best for the patient was discussed. The risk were discussed and include but not limited to the following:  Breast asymmetry, fluid accumulation, firmness of the breast, inability to breast feed, loss of nipple or areola, skin loss, change in skin and nipple sensation, fat necrosis of the breast tissue, bleeding, infection and healing delay.  There are risks of anesthesia and injury to nerves or blood vessels.  Allergic reaction to tape, suture and skin glue are possible.  There will be swelling.  Any of these can lead to the need for revisional surgery.  A breast reduction has potential to interfere with diagnostic procedures in the future.  This procedure is best done when the breast is fully developed.  Changes in the breast will continue to occur over time: pregnancy, weight gain or weigh loss.    Total time: 40 minutes. This includes time spent with the patient during the visit as well as time spent before and after the visit reviewing the chart, documenting the encounter, ordering pertinent studies and literature for the patient.   Physical therapy:  may be needed. Mammogram:  done   The patient will see [place] back in 2 months and we will reevaluate at that time.  I think she is a really good candidate for bilateral breast reduction with possible liposuction  Pictures were obtained of the patient and placed in the chart with the patient's or guardian's permission.   [place], DO

## 2022-03-21 ENCOUNTER — Telehealth: Payer: Self-pay | Admitting: Plastic Surgery

## 2022-03-21 ENCOUNTER — Ambulatory Visit: Payer: No Typology Code available for payment source | Admitting: Student

## 2022-03-21 VITALS — Wt 216.0 lb

## 2022-03-21 DIAGNOSIS — M549 Dorsalgia, unspecified: Secondary | ICD-10-CM

## 2022-03-21 DIAGNOSIS — N62 Hypertrophy of breast: Secondary | ICD-10-CM | POA: Diagnosis not present

## 2022-03-21 NOTE — Telephone Encounter (Signed)
LVM for pt to call office to schedule appt for 7-8 weeks for a PT follow up.

## 2022-03-21 NOTE — Progress Notes (Signed)
   Referring Provider Shelda Pal, DO [address] [place],  [place] [address]   CC:  Chief Complaint  Patient presents with   Follow-up      Barbara Thornton is an 47 y.o. female.  HPI: Patient is a 47 y.o. year old female here for follow up after completing physical therapy for pain related to macromastia.   She was seen for initial consult by 01/17/2022.  At that time, patient reported symptoms including back pain, neck pain and activities that were hindered by her enlarged breast including exercise and running.  On exam, the STN on the right was 36 cm and the STN on the left was 37 cm.  She was 5' 2"$  and 216 pounds.  Her BMI was 39.5 kg/m.  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  Patient would like to be a C cup.  The estimated excess breast tissue to be removed at the time of surgery would be approximately 600 and 700 g bilaterally.  The patient at this visit reported that she would like to get a better weight before surgery.  Patient was found to be a good candidate for bilateral breast reduction and plan was for patient to follow back up in 2 months for reevaluation.  Today, patient reports she is doing well.  She states that she is still experiencing back pain related to her enlarged breasts.  Patient's weight today is 216 pounds.  She states that she has been exercising more and has been eating healthier.  She states that she is actively trying to lose weight.  Patient expresses the desire to proceed with surgical intervention.   Review of Systems General: Denies fevers chills or recent changes in her health MSK: Endorses ongoing back and neck discomfort Skin: Reports intermittent rashes  Physical Exam    03/21/2022    8:09 AM 01/17/2022   10:30 AM 12/03/2021   10:11 AM  Vitals with BMI  Height  5' 2"$  5' 2"$   Weight 216 lbs 216 lbs 13 oz 218 lbs 8 oz  BMI  [id number]  Systolic  [id number]  Diastolic  90 80  Pulse  78 78    General:  No acute  distress,  Alert and oriented, Non-Toxic, Normal speech and affect Psych: Normal behavior and mood Respiratory: No increased WOB MSK: Ambulatory  Assessment/Plan  Symptomatic mammary hypertrophy   I discussed with the patient that given she is still experiencing symptoms, we will have her try physical therapy to see if this helps with her symptoms.  Patient was in agreement with this.  I encouraged the patient to continue to work on her weight reduction.  I offered the patient a referral to healthy weight and wellness but she declined at this time.  I encouraged the patient to continue to exercise, and to eat a diet that is high in proteins and nutrient dense foods such as lean proteins and vegetables.  Patient expressed understanding.  We will have the patient follow back up after she has completed physical therapy.  I instructed the patient in the meantime to call if she has any questions or concerns.  Clance Boll 03/21/2022, 10:15 AM

## 2022-05-15 ENCOUNTER — Ambulatory Visit: Payer: No Typology Code available for payment source | Admitting: Student

## 2022-05-15 DIAGNOSIS — R21 Rash and other nonspecific skin eruption: Secondary | ICD-10-CM | POA: Diagnosis not present

## 2022-05-15 DIAGNOSIS — M549 Dorsalgia, unspecified: Secondary | ICD-10-CM | POA: Diagnosis not present

## 2022-05-15 DIAGNOSIS — M542 Cervicalgia: Secondary | ICD-10-CM

## 2022-05-15 DIAGNOSIS — N62 Hypertrophy of breast: Secondary | ICD-10-CM

## 2022-05-15 NOTE — Progress Notes (Signed)
Referring Provider Sharlene Dory, DO [address] [place],  [place] [address]   CC:  Chief Complaint  Patient presents with   Follow-up      Barbara Thornton is an 47 y.o. female.  HPI: Patient is a 47 y.o. year old female here for follow up after completing physical therapy for pain related to macromastia.   She was seen for initial consult by 01/17/2022.  At that time, patient reported symptoms including back pain, neck pain and activities that were hindered by her enlarged breast including exercise and running.  On exam, the STN on the right was 36 cm and the STN on the left was 37 cm.  She was  and 216 pounds.  Her BMI was 39.5 kg/m.  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  Patient would like to be a C cup.  The estimated excess breast tissue to be removed at the time of surgery would be approximately 600 and 700 g bilaterally.  The patient at this visit reported that she would like to get a better weight before surgery.  Patient was found to be a good candidate for bilateral breast reduction and plan was for patient to follow back up in 2 months for reevaluation   Patient was then seen on 03/21/2022 for further follow-up to discuss her surgery.  At this visit, patient reported she is doing well.  She reported that she was still experiencing back pain related to her enlarged breasts.  Patient reported that she had been exercising more and eating healthier, and was actively trying to lose weight.  Patient expressed that she wanted to proceed with surgical intervention.  Given that patient was still experiencing symptoms, physical therapy was ordered for her.  It was also encouraged that patient continue to work on her weight reduction.  Plan was for patient to follow-up after she completed physical therapy.  Today, patient reports she is doing well. She states she has completed physical therapy.  She states she has been doing it for about 3 times a week for the  past few weeks.  Patient states that she is still having symptoms including back pain, neck pain and shoulder pain.  She states also that she is getting rashes underneath her breasts.  Patient states that she would like to pursue surgical intervention for bilateral breast reduction.  Patient states that she has also been working on weight loss and has lost 10 pounds since her previous visit.   Review of Systems General: Denies any fevers or chills or recent changes in her health MSK: Endorses ongoing back and neck discomfort Skin: Reports rashes  Physical Exam    03/21/2022    8:09 AM 01/17/2022   10:30 AM 12/03/2021   10:11 AM  Vitals with BMI  Height     Weight 216 lbs 216 lbs 13 oz 218 lbs 8 oz  BMI  39.64 39.95  Systolic  141 132  Diastolic  90 80  Pulse  78 78    General:  No acute distress,  Alert and oriented, Non-Toxic, Normal speech and affect Psych: Normal behavior and mood Respiratory: No increased WOB MSK: Ambulatory  Assessment/Plan  Symptomatic mammary hypertrophy   Patient is interested in pursuing surgical intervention for bilateral breast reduction. Patient has completed at least 6 weeks of physical therapy for pain related to macromastia.  Discussed with patient we would submit to insurance for authorization, discussed approval could take  up to 6 weeks.   I encouraged the patient to continue to work on her weight reduction.  Laurena Spies 05/15/2022, 1:56 PM

## 2022-05-16 ENCOUNTER — Other Ambulatory Visit: Payer: Self-pay | Admitting: Family Medicine

## 2022-05-16 DIAGNOSIS — E1165 Type 2 diabetes mellitus with hyperglycemia: Secondary | ICD-10-CM

## 2022-06-03 ENCOUNTER — Telehealth: Payer: No Typology Code available for payment source | Admitting: Plastic Surgery

## 2022-06-03 ENCOUNTER — Encounter: Payer: Self-pay | Admitting: Plastic Surgery

## 2022-06-03 ENCOUNTER — Encounter: Payer: Self-pay | Admitting: Family Medicine

## 2022-06-03 ENCOUNTER — Ambulatory Visit (INDEPENDENT_AMBULATORY_CARE_PROVIDER_SITE_OTHER): Payer: No Typology Code available for payment source | Admitting: Family Medicine

## 2022-06-03 VITALS — BP 132/80 | HR 72 | Temp 98.3°F | Ht 62.0 in | Wt 203.1 lb

## 2022-06-03 DIAGNOSIS — E1165 Type 2 diabetes mellitus with hyperglycemia: Secondary | ICD-10-CM | POA: Diagnosis not present

## 2022-06-03 DIAGNOSIS — N62 Hypertrophy of breast: Secondary | ICD-10-CM

## 2022-06-03 LAB — COMPREHENSIVE METABOLIC PANEL
ALT: 18 U/L (ref 0–35)
AST: 15 U/L (ref 0–37)
Albumin: 4.1 g/dL (ref 3.5–5.2)
Alkaline Phosphatase: 64 U/L (ref 39–117)
BUN: 13 mg/dL (ref 6–23)
CO2: 28 mEq/L (ref 19–32)
Calcium: 9.3 mg/dL (ref 8.4–10.5)
Chloride: 104 mEq/L (ref 96–112)
Creatinine, Ser: 0.72 mg/dL (ref 0.40–1.20)
GFR: 100.12 mL/min (ref >60.00)
Glucose, Bld: 100 mg/dL — ABNORMAL HIGH (ref 70–99)
Potassium: 4.4 mEq/L (ref 3.5–5.1)
Sodium: 141 mEq/L (ref 135–145)
Total Bilirubin: 0.6 mg/dL (ref 0.2–1.2)
Total Protein: 7.2 g/dL (ref 6.0–8.3)

## 2022-06-03 LAB — MICROALBUMIN / CREATININE URINE RATIO
Creatinine,U: 155.1 mg/dL
Microalb Creat Ratio: 0.5 mg/g (ref 0.0–30.0)
Microalb, Ur: <0.7 mg/dL (ref 0.0–1.9)

## 2022-06-03 LAB — LIPID PANEL
Cholesterol: 131 mg/dL (ref 0–200)
HDL: 39.8 mg/dL (ref >39.00)
LDL Cholesterol: 78 mg/dL (ref 0–99)
NonHDL: 91.17
Total CHOL/HDL Ratio: 3
Triglycerides: 66 mg/dL (ref 0.0–149.0)
VLDL: 13.2 mg/dL (ref 0.0–40.0)

## 2022-06-03 LAB — HEMOGLOBIN A1C: Hgb A1c MFr Bld: 6.4 % (ref 4.6–6.5)

## 2022-06-03 MED ORDER — ROSUVASTATIN CALCIUM 20 MG PO TABS: 20.0000 mg | ORAL_TABLET | Freq: Every day | ORAL | 3 refills | Status: DC

## 2022-06-03 NOTE — Progress Notes (Addendum)
   Subjective:    Patient ID: Barbara Thornton, female    DOB: 1975-11-30, 47 y.o.   MRN: [id number]  Patient is a 47 year old female joining me by virtual visit for further discussion about breast reduction.  She is done really well to lose some weight.  She is now down to 192 pounds from 216 pounds.  She is 5 feet 2 inches tall.  Her body body mass index is 35.1 kg/m.  The estimated amount to be removed is now 850 g.  She still complaining of neck and back pain.  She has skin breakdown and skin rashes.       Review of Systems  Constitutional: Negative.   HENT: Negative.    Eyes: Negative.   Respiratory: Negative.    Cardiovascular: Negative.   Gastrointestinal: Negative.   Endocrine: Negative.        Objective:   Physical Exam      Assessment & Plan:     ICD-10-CM   1. Symptomatic mammary hypertrophy  N62        I connected with  Barbara Thornton on 06/03/22 by a video enabled telemedicine application and verified that I am speaking with the correct person using two identifiers. The patient was at home and I was at the office.   The patient wants to move ahead    I discussed the limitations of evaluation and management by telemedicine. The patient expressed understanding and agreed to proceed.

## 2022-06-03 NOTE — Patient Instructions (Signed)
Give us 2-3 business days to get the results of your labs back.   Keep the diet clean and stay active.  Very strong work losing weight.  Let us know if you need anything.  

## 2022-06-03 NOTE — Progress Notes (Signed)
Subjective:   Chief Complaint  Patient presents with   Follow-up    Barbara Thornton is a 47 y.o. female here for follow-up of diabetes.   [name] does not routinely monitor her sugars.  Patient does not require insulin.   Medications include: diet controlled She is on a statin, Crestor 20 mg/d.  Diet is very healthy.  Exercise: lifting wts, cardio No Cp or SOB.   Past Medical History:  Diagnosis Date   Anemia    DVT (deep venous thrombosis) (HCC) 01/07/2016   right leg, from BCP's then blood thinners used, now IUD to prevent clotting   Hx of adenomatous colonic polyps 05/23/2021   2 diminutive adenomas-recall 2030     Related testing: Retinal exam: Done Pneumovax: Done  Objective:  BP 132/80 (BP Location: Left Arm, Patient Position: Sitting, Cuff Size: Large)   Pulse 72   Temp 98.3 F (36.8 C) (Oral)   Ht 5\' 2"  (1.575 m)   Wt 203 lb 2 oz (92.1 kg)   SpO2 99%   BMI 37.15 kg/m  General:  Well developed, well nourished, in no apparent distress Skin:  Warm, no pallor or diaphoresis on exposed skin Lungs:  CTAB, no access msc use Cardio:  RRR, no bruits, no LE edema Psych: Age appropriate judgment and insight  Assessment:   Type 2 diabetes mellitus with hyperglycemia, without long-term current use of insulin (HCC) - Plan: Comprehensive metabolic panel, Lipid panel, Hemoglobin A1c, Microalbumin / creatinine urine ratio, rosuvastatin (CRESTOR) 20 MG tablet   Plan:   Chronic, probably stable. Cont diet control. Cont Crestor 20 mg/d. Counseled on diet and exercise. Very strong work losing wt. Ck above.  F/u in 6 mo. The patient voiced understanding and agreement to the plan.  [name], DO 06/03/22 11:02 AM

## 2022-06-04 ENCOUNTER — Telehealth: Payer: Self-pay

## 2022-06-04 NOTE — Telephone Encounter (Signed)
Sent note to Dr. Ulice Bold to amend notes to 855 grams due to [employer]'s scale.

## 2022-06-24 ENCOUNTER — Telehealth: Payer: Self-pay | Admitting: Plastic Surgery

## 2022-06-24 ENCOUNTER — Telehealth: Payer: Self-pay

## 2022-06-24 NOTE — Telephone Encounter (Signed)
[facility] contact office to schedule sx

## 2022-06-24 NOTE — Telephone Encounter (Signed)
CPT [id number] approved #[id number] 05/28-11/24/2024/. Patient is aware.

## 2022-07-08 ENCOUNTER — Ambulatory Visit (INDEPENDENT_AMBULATORY_CARE_PROVIDER_SITE_OTHER): Payer: No Typology Code available for payment source | Admitting: Physician Assistant

## 2022-07-08 ENCOUNTER — Encounter: Payer: Self-pay | Admitting: Physician Assistant

## 2022-07-08 VITALS — BP 167/96 | HR 79 | Ht 62.0 in | Wt 198.4 lb

## 2022-07-08 DIAGNOSIS — N62 Hypertrophy of breast: Secondary | ICD-10-CM

## 2022-07-08 MED ORDER — ONDANSETRON 4 MG PO TBDP: 4.0000 mg | ORAL_TABLET | Freq: Three times a day (TID) | ORAL | 0 refills | Status: DC | PRN

## 2022-07-08 MED ORDER — CEPHALEXIN 500 MG PO CAPS: 500.0000 mg | ORAL_CAPSULE | Freq: Four times a day (QID) | ORAL | 0 refills | Status: AC

## 2022-07-08 MED ORDER — OXYCODONE HCL 5 MG PO TABS: 5.0000 mg | ORAL_TABLET | Freq: Four times a day (QID) | ORAL | 0 refills | Status: AC | PRN

## 2022-07-08 NOTE — H&P (View-Only) (Signed)
   Patient ID: Barbara Thornton, female    DOB: 06/14/1975, 47 y.o.   MRN: [id number]  Chief Complaint  Patient presents with   Pre-op Exam      ICD-10-CM   1. Symptomatic mammary hypertrophy  N62        History of Present Illness: Barbara Thornton is a 47 y.o.  female  with a history of macromastia.  She presents for preoperative evaluation for upcoming procedure, bilateral breast reduction possible liposuction, scheduled for 07/23/2022 with Dr. Dillingham.  The patient has not had problems with anesthesia.  Recent colonoscopy without complication.  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  She understands the limitations regarding the amount of tissue that can be excised safely in order to preserve shape of breasts as well as viability of areolas.  She denies any personal or family history of blood clots or clotting disorder.  Denies any personal history of severe cardiac or pulmonary disease, use of anticoagulation, nicotine use, cancer, or varicosities.  She has lost 30 pounds in the past 6 months and her A1c is downtrending from 6.8% to 6.4%.  Diet-controlled T2DM.  She has 2 daughters in their 20s will be assisting with her postoperative recovery.  Discussed expectations regarding surgery as well as her postoperative recovery including compressive garments and activity modifications.  She plans to remove her nipple piercings and understands that when the pedicles are rotated into position the piercings will likely no longer be horizontal.  She understands and is agreeable.  As for her other piercings, she will either remove completely or place a plastic retainer.  Summary of Previous Visit: Patient had initial consult with Dr. Dillingham 01/17/2022.  At that time, complained of chronic upper back and neck discomfort in the context of large breasts.  Preoperative bra size equals DDD cup.  She expressed that she would like to be a C cup  postoperatively.  STN 36 cm on the right, 37 cm the left.  Per updated video visit, BMI equals 35.1 kg/m.  Estimated excess tissue to be removed at time of surgery equals 850 g each side.  Patient expressed understanding and was agreeable to proceed.  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  She is already taking 2 weeks off PTO.  She does not believe that FMLA or STD will be required.  PMH Significant for: Macromastia, T2DM with most recent A1c improved to 6.4% from 6.8%, HLD.   Past Medical History: Allergies: No Known Allergies  Current Medications:  Current Outpatient Medications:    levonorgestrel (MIRENA) 20 MCG/24HR IUD, 1 each by Intrauterine route once., Disp: , Rfl:    rosuvastatin (CRESTOR) 20 MG tablet, Take 1 tablet (20 mg total) by mouth daily., Disp: 90 tablet, Rfl: 3  Past Medical Problems: Past Medical History:  Diagnosis Date   Anemia    DVT (deep venous thrombosis) (HCC) 01/07/2016   right leg, from BCP's then blood thinners used, now IUD to prevent clotting   Hx of adenomatous colonic polyps 05/23/2021   2 diminutive adenomas-recall 2030    Past Surgical History: Past Surgical History:  Procedure Laterality Date   CESAREAN SECTION  1999   epidural   INTRAUTERINE DEVICE INSERTION  01/29/2016   Mirena   TUBAL LIGATION  1999    Social History: Social History   Socioeconomic History   Marital status: Single    Spouse name: Not on   file   Number of children: Not on file   Years of education: Not on file   Highest education level: Some college, no degree  Occupational History   Not on file  Tobacco Use   Smoking status: Former    Packs/day: 0.50    Years: 17.00    Additional pack years: 0.00    Total pack years: 8.50    Types: Cigarettes    Quit date: 11/27/2012    Years since quitting: 9.6   Smokeless tobacco: Never  Vaping Use   Vaping Use: Never used  Substance and Sexual Activity   Alcohol use: Yes    Comment: very occasional, glass of  wine 2-3 times per year   Drug use: Not Currently   Sexual activity: Not Currently    Partners: Male    Birth control/protection: I.U.D., Surgical  Other Topics Concern   Not on file  Social History Narrative   Not on file   Social Determinants of Health   Financial Resource Strain: Medium Risk (06/02/2022)   Overall Financial Resource Strain (CARDIA)    Difficulty of Paying Living Expenses: Somewhat hard  Food Insecurity: No Food Insecurity (06/02/2022)   Hunger Vital Sign    Worried About Running Out of Food in the Last Year: Never true    Ran Out of Food in the Last Year: Never true  Transportation Needs: No Transportation Needs (06/02/2022)   PRAPARE - Transportation    Lack of Transportation (Medical): No    Lack of Transportation (Non-Medical): No  Physical Activity: Sufficiently Active (06/02/2022)   Exercise Vital Sign    Days of Exercise per Week: 3 days    Minutes of Exercise per Session: 60 min  Stress: No Stress Concern Present (06/02/2022)   [employer] - Occupational Stress Questionnaire    Feeling of Stress : Not at all  Social Connections: Moderately Isolated (06/02/2022)   Social Connection and Isolation Panel [NHANES]    Frequency of Communication with Friends and Family: More than three times a week    Frequency of Social Gatherings with Friends and Family: Twice a week    Attends Religious Services: More than 4 times per year    Active Member of Clubs or Organizations: No    Attends Club or Organization Meetings: Not on file    Marital Status: Separated  Intimate Partner Violence: Not on file    Family History: Family History  Problem Relation Age of Onset   Hypertension Mother    Diabetes Mother    Breast cancer Maternal Aunt        unsure of age   Breast cancer Cousin        unsure of age   Colon cancer Neg Hx    Colon polyps Neg Hx    Esophageal cancer Neg Hx    Rectal cancer Neg Hx    Stomach cancer Neg Hx     Review of  Systems: ROS Denies any recent chest pain, difficulty breathing, leg swelling, or fevers.  Physical Exam: Vital Signs BP (!) 167/96 (BP Location: Left Arm, Patient Position: Sitting, Cuff Size: Normal)   Pulse 79   Ht 5' 2" (1.575 m)   Wt 198 lb 6.4 oz (90 kg)   SpO2 96%   BMI 36.29 kg/m   Physical Exam Constitutional:      General: Not in acute distress.    Appearance: Normal appearance. Not ill-appearing.  HENT:     Head: Normocephalic and atraumatic.    Eyes:     Pupils: Pupils are equal, round. Cardiovascular:     Rate and Rhythm: Normal rate.    Pulses: Normal pulses.  Pulmonary:     Effort: No respiratory distress or increased work of breathing.  Speaks in full sentences. Abdominal:     General: Abdomen is flat. No distension.   Musculoskeletal: Normal range of motion. No lower extremity swelling or edema. No varicosities.  Skin:    General: Skin is warm and dry.     Findings: No erythema or rash.  Neurological:     Mental Status: Alert and oriented to person, place, and time.  Psychiatric:        Mood and Affect: Mood normal.        Behavior: Behavior normal.    Assessment/Plan: The patient is scheduled for bilateral breast reduction possible liposuction with Dr. Dillingham.  Risks, benefits, and alternatives of procedure discussed, questions answered and consent obtained.    Smoking Status: Non-smoker. Last Mammogram: 04/2021; Results: BI-RADS Category 1: Negative.  Caprini Score: 4; Risk Factors include: Age, BMI greater than 25, and length of planned surgery. Recommendation for mechanical prophylaxis. Encourage early ambulation.   Pictures obtained: 01/17/2022.  Post-op Rx sent to pharmacy: Oxycodone, Keflex, Zofran.  Patient was provided with the General Surgical Risk consent document and Pain Medication Agreement prior to their appointment.  They had adequate time to read through the risk consent documents and Pain Medication Agreement. We also discussed  them in person together during this preop appointment. All of their questions were answered to their satisfaction.  Recommended calling if they have any further questions.  Risk consent form and Pain Medication Agreement to be scanned into patient's chart.  The risk that can be encountered with breast reduction were discussed and include the following but not limited to these:  Breast asymmetry, fluid accumulation, firmness of the breast, inability to breast feed, loss of nipple or areola, skin loss, decrease or no nipple sensation, fat necrosis of the breast tissue, bleeding, infection, healing delay.  There are risks of anesthesia, changes to skin sensation and injury to nerves or blood vessels.  The muscle can be temporarily or permanently injured.  You may have an allergic reaction to tape, suture, glue, blood products which can result in skin discoloration, swelling, pain, skin lesions, poor healing.  Any of these can lead to the need for revisonal surgery or stage procedures.  A reduction has potential to interfere with diagnostic procedures.  Nipple or breast piercing can increase risks of infection.  This procedure is best done when the breast is fully developed.  Changes in the breast will continue to occur over time.  Pregnancy can alter the outcomes of previous breast reduction surgery, weight gain and weigh loss can also effect the long term appearance.     Electronically signed by: Amogh Komatsu, PA-C 07/08/2022 3:36 PM 

## 2022-07-08 NOTE — Progress Notes (Signed)
Patient ID: Barbara Thornton, female    DOB: 1975-07-13, 47 y.o.   MRN: [id number]  Chief Complaint  Patient presents with   Pre-op Exam      ICD-10-CM   1. Symptomatic mammary hypertrophy  N62        History of Present Illness: Barbara Thornton is a 47 y.o.  female  with a history of macromastia.  She presents for preoperative evaluation for upcoming procedure, bilateral breast reduction possible liposuction, scheduled for 07/23/2022 with Dr. Ulice Bold.  The patient has not had problems with anesthesia.  Recent colonoscopy without complication.  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  She understands the limitations regarding the amount of tissue that can be excised safely in order to preserve shape of breasts as well as viability of areolas.  She denies any personal or family history of blood clots or clotting disorder.  Denies any personal history of severe cardiac or pulmonary disease, use of anticoagulation, nicotine use, cancer, or varicosities.  She has lost 30 pounds in the past 6 months and her A1c is downtrending from 6.8% to 6.4%.  Diet-controlled T2DM.  She has 2 daughters in their 54s will be assisting with her postoperative recovery.  Discussed expectations regarding surgery as well as her postoperative recovery including compressive garments and activity modifications.  She plans to remove her nipple piercings and understands that when the pedicles are rotated into position the piercings will likely no longer be horizontal.  She understands and is agreeable.  As for her other piercings, she will either remove completely or place a plastic retainer.  Summary of Previous Visit: Patient had initial consult with Dr. Ulice Bold 01/17/2022.  At that time, complained of chronic upper back and neck discomfort in the context of large breasts.  Preoperative bra size equals DDD cup.  She expressed that she would like to be a C cup  postoperatively.  STN 36 cm on the right, 37 cm the left.  Per updated video visit, BMI equals 35.1 kg/m.  Estimated excess tissue to be removed at time of surgery equals 850 g each side.  Patient expressed understanding and was agreeable to proceed.  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  She is already taking 2 weeks off PTO.  She does not believe that FMLA or STD will be required.  PMH Significant for: Macromastia, T2DM with most recent A1c improved to 6.4% from 6.8%, HLD.   Past Medical History: Allergies: No Known Allergies  Current Medications:  Current Outpatient Medications:    levonorgestrel (MIRENA) 20 MCG/24HR IUD, 1 each by Intrauterine route once., Disp: , Rfl:    rosuvastatin (CRESTOR) 20 MG tablet, Take 1 tablet (20 mg total) by mouth daily., Disp: 90 tablet, Rfl: 3  Past Medical Problems: Past Medical History:  Diagnosis Date   Anemia    DVT (deep venous thrombosis) (HCC) 01/07/2016   right leg, from BCP's then blood thinners used, now IUD to prevent clotting   Hx of adenomatous colonic polyps 05/23/2021   2 diminutive adenomas-recall 2030    Past Surgical History: Past Surgical History:  Procedure Laterality Date   CESAREAN SECTION  1999   epidural   INTRAUTERINE DEVICE INSERTION  01/29/2016   Mirena   TUBAL LIGATION  1999    Social History: Social History   Socioeconomic History   Marital status: Single    Spouse name: Not on  file   Number of children: Not on file   Years of education: Not on file   Highest education level: Some college, no degree  Occupational History   Not on file  Tobacco Use   Smoking status: Former    Packs/day: 0.50    Years: 17.00    Additional pack years: 0.00    Total pack years: 8.50    Types: Cigarettes    Quit date: 11/27/2012    Years since quitting: 9.6   Smokeless tobacco: Never  Vaping Use   Vaping Use: Never used  Substance and Sexual Activity   Alcohol use: Yes    Comment: very occasional, glass of  wine 2-3 times per year   Drug use: Not Currently   Sexual activity: Not Currently    Partners: Male    Birth control/protection: I.U.D., Surgical  Other Topics Concern   Not on file  Social History Narrative   Not on file   Social Determinants of Health   Financial Resource Strain: Medium Risk (06/02/2022)   Overall Financial Resource Strain (CARDIA)    Difficulty of Paying Living Expenses: Somewhat hard  Food Insecurity: No Food Insecurity (06/02/2022)   Hunger Vital Sign    Worried About Running Out of Food in the Last Year: Never true    Ran Out of Food in the Last Year: Never true  Transportation Needs: No Transportation Needs (06/02/2022)   PRAPARE - Administrator, Civil Service (Medical): No    Lack of Transportation (Non-Medical): No  Physical Activity: Sufficiently Active (06/02/2022)   Exercise Vital Sign    Days of Exercise per Week: 3 days    Minutes of Exercise per Session: 60 min  Stress: No Stress Concern Present (06/02/2022)   [employer] of Occupational Health - Occupational Stress Questionnaire    Feeling of Stress : Not at all  Social Connections: Moderately Isolated (06/02/2022)   Social Connection and Isolation Panel [NHANES]    Frequency of Communication with Friends and Family: More than three times a week    Frequency of Social Gatherings with Friends and Family: Twice a week    Attends Religious Services: More than 4 times per year    Active Member of [employer] or Organizations: No    Attends Engineer, structural: Not on file    Marital Status: Separated  Intimate Partner Violence: Not on file    Family History: Family History  Problem Relation Age of Onset   Hypertension Mother    Diabetes Mother    Breast cancer Maternal Aunt        unsure of age   Breast cancer Cousin        unsure of age   Colon cancer Neg Hx    Colon polyps Neg Hx    Esophageal cancer Neg Hx    Rectal cancer Neg Hx    Stomach cancer Neg Hx     Review of  Systems: ROS Denies any recent chest pain, difficulty breathing, leg swelling, or fevers.  Physical Exam: Vital Signs BP (!) 167/96 (BP Location: Left Arm, Patient Position: Sitting, Cuff Size: Normal)   Pulse 79   Ht 5\' 2"  (1.575 m)   Wt 198 lb 6.4 oz (90 kg)   SpO2 96%   BMI 36.29 kg/m   Physical Exam Constitutional:      General: Not in acute distress.    Appearance: Normal appearance. Not ill-appearing.  HENT:     Head: Normocephalic and atraumatic.  Eyes:     Pupils: Pupils are equal, round. Cardiovascular:     Rate and Rhythm: Normal rate.    Pulses: Normal pulses.  Pulmonary:     Effort: No respiratory distress or increased work of breathing.  Speaks in full sentences. Abdominal:     General: Abdomen is flat. No distension.   Musculoskeletal: Normal range of motion. No lower extremity swelling or edema. No varicosities.  Skin:    General: Skin is warm and dry.     Findings: No erythema or rash.  Neurological:     Mental Status: Alert and oriented to person, place, and time.  Psychiatric:        Mood and Affect: Mood normal.        Behavior: Behavior normal.    Assessment/Plan: The patient is scheduled for bilateral breast reduction possible liposuction with Dr. Ulice Bold.  Risks, benefits, and alternatives of procedure discussed, questions answered and consent obtained.    Smoking Status: Non-smoker. Last Mammogram: 04/2021; Results: BI-RADS Category 1: Negative.  Caprini Score: 4; Risk Factors include: Age, BMI greater than 25, and length of planned surgery. Recommendation for mechanical prophylaxis. Encourage early ambulation.   Pictures obtained: 01/17/2022.  Post-op Rx sent to pharmacy: Oxycodone, Keflex, Zofran.  Patient was provided with the General Surgical Risk consent document and Pain Medication Agreement prior to their appointment.  They had adequate time to read through the risk consent documents and Pain Medication Agreement. We also discussed  them in person together during this preop appointment. All of their questions were answered to their satisfaction.  Recommended calling if they have any further questions.  Risk consent form and Pain Medication Agreement to be scanned into patient's chart.  The risk that can be encountered with breast reduction were discussed and include the following but not limited to these:  Breast asymmetry, fluid accumulation, firmness of the breast, inability to breast feed, loss of nipple or areola, skin loss, decrease or no nipple sensation, fat necrosis of the breast tissue, bleeding, infection, healing delay.  There are risks of anesthesia, changes to skin sensation and injury to nerves or blood vessels.  The muscle can be temporarily or permanently injured.  You may have an allergic reaction to tape, suture, glue, blood products which can result in skin discoloration, swelling, pain, skin lesions, poor healing.  Any of these can lead to the need for revisonal surgery or stage procedures.  A reduction has potential to interfere with diagnostic procedures.  Nipple or breast piercing can increase risks of infection.  This procedure is best done when the breast is fully developed.  Changes in the breast will continue to occur over time.  Pregnancy can alter the outcomes of previous breast reduction surgery, weight gain and weigh loss can also effect the long term appearance.     Electronically signed by: Evelena Leyden, PA-C 07/08/2022 3:36 PM

## 2022-07-15 ENCOUNTER — Other Ambulatory Visit: Payer: Self-pay

## 2022-07-15 ENCOUNTER — Encounter (HOSPITAL_BASED_OUTPATIENT_CLINIC_OR_DEPARTMENT_OTHER): Payer: Self-pay | Admitting: Plastic Surgery

## 2022-07-23 ENCOUNTER — Ambulatory Visit (HOSPITAL_BASED_OUTPATIENT_CLINIC_OR_DEPARTMENT_OTHER)
Admission: RE | Admit: 2022-07-23 | Discharge: 2022-07-23 | Disposition: A | Discharge status: Home / Self Care | Payer: No Typology Code available for payment source | Attending: Plastic Surgery | Admitting: Plastic Surgery

## 2022-07-23 ENCOUNTER — Other Ambulatory Visit: Payer: Self-pay

## 2022-07-23 ENCOUNTER — Encounter (HOSPITAL_BASED_OUTPATIENT_CLINIC_OR_DEPARTMENT_OTHER)
Admission: RE | Disposition: A | Discharge status: Home / Self Care | Payer: Self-pay | Source: Home / Self Care | Attending: Plastic Surgery

## 2022-07-23 ENCOUNTER — Ambulatory Visit (HOSPITAL_BASED_OUTPATIENT_CLINIC_OR_DEPARTMENT_OTHER): Payer: No Typology Code available for payment source | Admitting: Certified Registered Nurse Anesthetist

## 2022-07-23 ENCOUNTER — Encounter (HOSPITAL_BASED_OUTPATIENT_CLINIC_OR_DEPARTMENT_OTHER): Payer: Self-pay | Admitting: Plastic Surgery

## 2022-07-23 DIAGNOSIS — E669 Obesity, unspecified: Secondary | ICD-10-CM | POA: Diagnosis not present

## 2022-07-23 DIAGNOSIS — Z6836 Body mass index (BMI) 36.0-36.9, adult: Secondary | ICD-10-CM | POA: Diagnosis not present

## 2022-07-23 DIAGNOSIS — Z09 Encounter for follow-up examination after completed treatment for conditions other than malignant neoplasm: Secondary | ICD-10-CM | POA: Diagnosis not present

## 2022-07-23 DIAGNOSIS — Z87891 Personal history of nicotine dependence: Secondary | ICD-10-CM | POA: Insufficient documentation

## 2022-07-23 DIAGNOSIS — E785 Hyperlipidemia, unspecified: Secondary | ICD-10-CM | POA: Insufficient documentation

## 2022-07-23 DIAGNOSIS — E119 Type 2 diabetes mellitus without complications: Secondary | ICD-10-CM | POA: Insufficient documentation

## 2022-07-23 DIAGNOSIS — M549 Dorsalgia, unspecified: Secondary | ICD-10-CM | POA: Diagnosis not present

## 2022-07-23 DIAGNOSIS — Z01818 Encounter for other preprocedural examination: Secondary | ICD-10-CM

## 2022-07-23 DIAGNOSIS — N62 Hypertrophy of breast: Secondary | ICD-10-CM

## 2022-07-23 DIAGNOSIS — M542 Cervicalgia: Secondary | ICD-10-CM | POA: Diagnosis not present

## 2022-07-23 HISTORY — PX: BREAST REDUCTION SURGERY: SHX8

## 2022-07-23 LAB — POCT PREGNANCY, URINE: Preg Test, Ur: NEGATIVE

## 2022-07-23 SURGERY — BREAST REDUCTION WITH LIPOSUCTION
Anesthesia: General | Site: Breast | Laterality: Bilateral

## 2022-07-23 MED ORDER — CEFAZOLIN SODIUM-DEXTROSE 2-4 GM/100ML-% IV SOLN
INTRAVENOUS | Status: AC
  Filled 2022-07-23: qty 100

## 2022-07-23 MED ORDER — EPHEDRINE SULFATE (PRESSORS) 50 MG/ML IJ SOLN
INTRAMUSCULAR | Status: DC | PRN
  Administered 2022-07-23 (×2): 10 mg via INTRAVENOUS

## 2022-07-23 MED ORDER — DEXAMETHASONE SODIUM PHOSPHATE 4 MG/ML IJ SOLN
INTRAMUSCULAR | Status: DC | PRN
  Administered 2022-07-23: 8 mg via INTRAVENOUS

## 2022-07-23 MED ORDER — KETAMINE HCL 100 MG/ML IJ SOLN
INTRAMUSCULAR | Status: AC
  Filled 2022-07-23: qty 1

## 2022-07-23 MED ORDER — FENTANYL CITRATE (PF) 100 MCG/2ML IJ SOLN
INTRAMUSCULAR | Status: DC | PRN
  Administered 2022-07-23 (×2): 50 ug via INTRAVENOUS

## 2022-07-23 MED ORDER — OXYCODONE HCL 5 MG PO TABS: 5.0000 mg | ORAL_TABLET | ORAL | Status: DC | PRN

## 2022-07-23 MED ORDER — PROMETHAZINE HCL 25 MG/ML IJ SOLN: 6.2500 mg | INTRAMUSCULAR | Status: DC | PRN

## 2022-07-23 MED ORDER — LACTATED RINGERS IV SOLN: INTRAVENOUS | Status: DC

## 2022-07-23 MED ORDER — AMISULPRIDE (ANTIEMETIC) 5 MG/2ML IV SOLN
INTRAVENOUS | Status: AC
  Filled 2022-07-23: qty 4

## 2022-07-23 MED ORDER — CHLORHEXIDINE GLUCONATE CLOTH 2 % EX PADS: 6.0000 | MEDICATED_PAD | Freq: Once | CUTANEOUS | Status: DC

## 2022-07-23 MED ORDER — OXYCODONE HCL 5 MG PO TABS: 5.0000 mg | ORAL_TABLET | Freq: Once | ORAL | Status: DC | PRN

## 2022-07-23 MED ORDER — ACETAMINOPHEN 325 MG RE SUPP: 650.0000 mg | RECTAL | Status: DC | PRN

## 2022-07-23 MED ORDER — LIDOCAINE HCL 1 % IJ SOLN
INTRAVENOUS | Status: DC | PRN
  Administered 2022-07-23: 600 mL

## 2022-07-23 MED ORDER — ACETAMINOPHEN 10 MG/ML IV SOLN
INTRAVENOUS | Status: DC | PRN
  Administered 2022-07-23: 1000 mg via INTRAVENOUS

## 2022-07-23 MED ORDER — BUPIVACAINE LIPOSOME 1.3 % IJ SUSP
INTRAMUSCULAR | Status: AC
  Filled 2022-07-23: qty 20

## 2022-07-23 MED ORDER — CEFAZOLIN SODIUM-DEXTROSE 2-4 GM/100ML-% IV SOLN
2.0000 g | INTRAVENOUS | Status: AC
  Administered 2022-07-23: 2 g via INTRAVENOUS

## 2022-07-23 MED ORDER — MIDAZOLAM HCL 2 MG/2ML IJ SOLN
INTRAMUSCULAR | Status: AC
  Filled 2022-07-23: qty 2

## 2022-07-23 MED ORDER — MIDAZOLAM HCL 5 MG/5ML IJ SOLN
INTRAMUSCULAR | Status: DC | PRN
  Administered 2022-07-23: 2 mg via INTRAVENOUS

## 2022-07-23 MED ORDER — HYDROMORPHONE HCL 1 MG/ML IJ SOLN
INTRAMUSCULAR | Status: DC | PRN
  Administered 2022-07-23 (×2): .5 mg via INTRAVENOUS

## 2022-07-23 MED ORDER — ONDANSETRON HCL 4 MG/2ML IJ SOLN
INTRAMUSCULAR | Status: DC | PRN
  Administered 2022-07-23: 4 mg via INTRAVENOUS

## 2022-07-23 MED ORDER — DEXAMETHASONE SODIUM PHOSPHATE 10 MG/ML IJ SOLN
INTRAMUSCULAR | Status: AC
  Filled 2022-07-23: qty 1

## 2022-07-23 MED ORDER — AMISULPRIDE (ANTIEMETIC) 5 MG/2ML IV SOLN
10.0000 mg | Freq: Once | INTRAVENOUS | Status: AC | PRN
  Administered 2022-07-23: 10 mg via INTRAVENOUS

## 2022-07-23 MED ORDER — SODIUM CHLORIDE 0.9% FLUSH: 3.0000 mL | INTRAVENOUS | Status: DC | PRN

## 2022-07-23 MED ORDER — ACETAMINOPHEN 325 MG PO TABS: 650.0000 mg | ORAL_TABLET | ORAL | Status: DC | PRN

## 2022-07-23 MED ORDER — FENTANYL CITRATE (PF) 100 MCG/2ML IJ SOLN: 25.0000 ug | INTRAMUSCULAR | Status: DC | PRN

## 2022-07-23 MED ORDER — ACETAMINOPHEN 10 MG/ML IV SOLN
INTRAVENOUS | Status: AC
  Filled 2022-07-23: qty 100

## 2022-07-23 MED ORDER — SODIUM CHLORIDE 0.9% FLUSH: 3.0000 mL | Freq: Two times a day (BID) | INTRAVENOUS | Status: DC

## 2022-07-23 MED ORDER — KETAMINE HCL 10 MG/ML IJ SOLN
INTRAMUSCULAR | Status: DC | PRN
  Administered 2022-07-23: 20 mg via INTRAVENOUS
  Administered 2022-07-23: 30 mg via INTRAVENOUS

## 2022-07-23 MED ORDER — PHENYLEPHRINE 80 MCG/ML (10ML) SYRINGE FOR IV PUSH (FOR BLOOD PRESSURE SUPPORT)
PREFILLED_SYRINGE | INTRAVENOUS | Status: AC
  Filled 2022-07-23: qty 10

## 2022-07-23 MED ORDER — ONDANSETRON HCL 4 MG/2ML IJ SOLN
INTRAMUSCULAR | Status: AC
  Filled 2022-07-23: qty 2

## 2022-07-23 MED ORDER — SODIUM CHLORIDE 0.9 % IV SOLN
INTRAVENOUS | Status: DC | PRN
  Administered 2022-07-23: 40 mL

## 2022-07-23 MED ORDER — HYDROMORPHONE HCL 1 MG/ML IJ SOLN: 0.2500 mg | INTRAMUSCULAR | Status: DC | PRN

## 2022-07-23 MED ORDER — LIDOCAINE HCL (CARDIAC) PF 100 MG/5ML IV SOSY
PREFILLED_SYRINGE | INTRAVENOUS | Status: DC | PRN
  Administered 2022-07-23: 50 mg via INTRAVENOUS

## 2022-07-23 MED ORDER — LIDOCAINE 2% (20 MG/ML) 5 ML SYRINGE
INTRAMUSCULAR | Status: AC
  Filled 2022-07-23: qty 5

## 2022-07-23 MED ORDER — FENTANYL CITRATE (PF) 100 MCG/2ML IJ SOLN
INTRAMUSCULAR | Status: AC
  Filled 2022-07-23: qty 2

## 2022-07-23 MED ORDER — MEPERIDINE HCL 25 MG/ML IJ SOLN: 6.2500 mg | INTRAMUSCULAR | Status: DC | PRN

## 2022-07-23 MED ORDER — PHENYLEPHRINE HCL-NACL 20-0.9 MG/250ML-% IV SOLN
INTRAVENOUS | Status: DC | PRN
  Administered 2022-07-23: 40 ug/min via INTRAVENOUS

## 2022-07-23 MED ORDER — LIDOCAINE-EPINEPHRINE 1 %-1:100000 IJ SOLN
INTRAMUSCULAR | Status: DC | PRN
  Administered 2022-07-23: 50 mL via INTRAMUSCULAR

## 2022-07-23 MED ORDER — OXYCODONE HCL 5 MG/5ML PO SOLN: 5.0000 mg | Freq: Once | ORAL | Status: DC | PRN

## 2022-07-23 MED ORDER — PHENYLEPHRINE HCL (PRESSORS) 10 MG/ML IV SOLN
INTRAVENOUS | Status: DC | PRN
  Administered 2022-07-23: 80 ug via INTRAVENOUS

## 2022-07-23 MED ORDER — PROPOFOL 10 MG/ML IV BOLUS
INTRAVENOUS | Status: AC
  Filled 2022-07-23: qty 20

## 2022-07-23 MED ORDER — HYDROMORPHONE HCL 1 MG/ML IJ SOLN
INTRAMUSCULAR | Status: AC
  Filled 2022-07-23: qty 1

## 2022-07-23 MED ORDER — SODIUM CHLORIDE 0.9 % IV SOLN: 250.0000 mL | INTRAVENOUS | Status: DC | PRN

## 2022-07-23 MED ORDER — DEXMEDETOMIDINE HCL IN NACL 80 MCG/20ML IV SOLN
INTRAVENOUS | Status: DC | PRN
  Administered 2022-07-23: 8 ug via INTRAVENOUS

## 2022-07-23 MED ORDER — DEXMEDETOMIDINE HCL IN NACL 80 MCG/20ML IV SOLN
INTRAVENOUS | Status: AC
  Filled 2022-07-23: qty 20

## 2022-07-23 MED ORDER — PROPOFOL 10 MG/ML IV BOLUS
INTRAVENOUS | Status: DC | PRN
  Administered 2022-07-23: 200 mg via INTRAVENOUS

## 2022-07-23 SURGICAL SUPPLY — 65 items
ADH SKN CLS APL DERMABOND .7 (GAUZE/BANDAGES/DRESSINGS) ×2
AGENT HMST PWDR BTL CLGN 5GM (Miscellaneous) ×1 IMPLANT
BAG DECANTER FOR FLEXI CONT (MISCELLANEOUS) ×1 IMPLANT
BINDER BREAST LRG (GAUZE/BANDAGES/DRESSINGS) IMPLANT
BINDER BREAST MEDIUM (GAUZE/BANDAGES/DRESSINGS) IMPLANT
BINDER BREAST XLRG (GAUZE/BANDAGES/DRESSINGS) IMPLANT
BINDER BREAST XXLRG (GAUZE/BANDAGES/DRESSINGS) IMPLANT
BIOPATCH RED 1 DISK 7.0 (GAUZE/BANDAGES/DRESSINGS) IMPLANT
BLADE HEX COATED 2.75 (ELECTRODE) IMPLANT
BLADE KNIFE PERSONA 10 (BLADE) ×2 IMPLANT
BLADE SURG 15 STRL LF DISP TIS (BLADE) ×1 IMPLANT
BLADE SURG 15 STRL SS (BLADE) ×1
CANISTER SUCT 1200ML W/VALVE (MISCELLANEOUS) ×1 IMPLANT
COLLAGEN CELLERATERX 5 GRAM (Miscellaneous) IMPLANT
COVER BACK TABLE 60X90IN (DRAPES) ×1 IMPLANT
COVER MAYO STAND STRL (DRAPES) ×1 IMPLANT
DERMABOND ADVANCED .7 DNX12 (GAUZE/BANDAGES/DRESSINGS) ×2 IMPLANT
DRAIN CHANNEL 19F RND (DRAIN) IMPLANT
DRAPE LAPAROSCOPIC ABDOMINAL (DRAPES) ×1 IMPLANT
DRSG MEPILEX POST OP 4X8 (GAUZE/BANDAGES/DRESSINGS) ×2 IMPLANT
DRSG TEGADERM 4X4.75 (GAUZE/BANDAGES/DRESSINGS) IMPLANT
ELECT BLADE 4.0 EZ CLEAN MEGAD (MISCELLANEOUS) ×1
ELECT REM PT RETURN 9FT ADLT (ELECTROSURGICAL) ×1
ELECTRODE BLDE 4.0 EZ CLN MEGD (MISCELLANEOUS) ×1 IMPLANT
ELECTRODE REM PT RTRN 9FT ADLT (ELECTROSURGICAL) ×1 IMPLANT
EVACUATOR SILICONE 100CC (DRAIN) IMPLANT
GAUZE PAD ABD 8X10 STRL (GAUZE/BANDAGES/DRESSINGS) ×2 IMPLANT
GLOVE BIO SURGEON STRL SZ 6.5 (GLOVE) ×3 IMPLANT
GLOVE BIO SURGEON STRL SZ7.5 (GLOVE) ×1 IMPLANT
GOWN STRL REUS W/ TWL LRG LVL3 (GOWN DISPOSABLE) ×2 IMPLANT
GOWN STRL REUS W/ TWL XL LVL3 (GOWN DISPOSABLE) ×1 IMPLANT
GOWN STRL REUS W/TWL LRG LVL3 (GOWN DISPOSABLE) ×2
GOWN STRL REUS W/TWL XL LVL3 (GOWN DISPOSABLE) ×1
NDL FILTER BLUNT 18X1 1/2 (NEEDLE) IMPLANT
NDL HYPO 25X1 1.5 SAFETY (NEEDLE) ×1 IMPLANT
NEEDLE FILTER BLUNT 18X1 1/2 (NEEDLE) IMPLANT
NEEDLE HYPO 25X1 1.5 SAFETY (NEEDLE) ×2 IMPLANT
NS IRRIG 1000ML POUR BTL (IV SOLUTION) ×1 IMPLANT
PACK BASIN DAY SURGERY FS (CUSTOM PROCEDURE TRAY) ×1 IMPLANT
PAD ALCOHOL SWAB (MISCELLANEOUS) IMPLANT
PAD FOAM SILICONE BACKED (GAUZE/BANDAGES/DRESSINGS) IMPLANT
PENCIL SMOKE EVACUATOR (MISCELLANEOUS) ×1 IMPLANT
PIN SAFETY STERILE (MISCELLANEOUS) IMPLANT
SLEEVE SCD COMPRESS KNEE MED (STOCKING) ×1 IMPLANT
SPIKE FLUID TRANSFER (MISCELLANEOUS) IMPLANT
SPONGE T-LAP 18X18 ~~LOC~~+RFID (SPONGE) ×2 IMPLANT
STRIP SUTURE WOUND CLOSURE 1/2 (MISCELLANEOUS) ×2 IMPLANT
SUT MNCRL AB 4-0 PS2 18 (SUTURE) ×4 IMPLANT
SUT MON AB 3-0 SH 27 (SUTURE) ×5
SUT MON AB 3-0 SH27 (SUTURE) ×4 IMPLANT
SUT MON AB 5-0 PS2 18 (SUTURE) IMPLANT
SUT PDS 3-0 CT2 (SUTURE) ×6
SUT PDS II 3-0 CT2 27 ABS (SUTURE) ×4 IMPLANT
SUT SILK 3 0 PS 1 (SUTURE) IMPLANT
SYR 50ML LL SCALE MARK (SYRINGE) IMPLANT
SYR BULB IRRIG 60ML STRL (SYRINGE) ×1 IMPLANT
SYR CONTROL 10ML LL (SYRINGE) ×1 IMPLANT
TAPE MEASURE VINYL STERILE (MISCELLANEOUS) IMPLANT
TOWEL GREEN STERILE FF (TOWEL DISPOSABLE) ×2 IMPLANT
TRAY DSU PREP LF (CUSTOM PROCEDURE TRAY) ×1 IMPLANT
TUBE CONNECTING 20X1/4 (TUBING) ×1 IMPLANT
TUBING INFILTRATION IT-10001 (TUBING) IMPLANT
TUBING SET GRADUATE ASPIR 12FT (MISCELLANEOUS) IMPLANT
UNDERPAD 30X36 HEAVY ABSORB (UNDERPADS AND DIAPERS) ×2 IMPLANT
YANKAUER SUCT BULB TIP NO VENT (SUCTIONS) ×1 IMPLANT

## 2022-07-23 NOTE — Discharge Instructions (Addendum)
INSTRUCTIONS FOR AFTER BREAST SURGERY   You will likely have some questions about what to expect following your operation.  The following information will help you and your family understand what to expect when you are discharged from the hospital.  It is important to follow these guidelines to help ensure a smooth recovery and reduce complication.  Postoperative instructions include information on: diet, wound care, medications and physical activity.  AFTER SURGERY Expect to go home after the procedure.  In some cases, you may need to spend one night in the hospital for observation.  DIET Breast surgery does not require a specific diet.  However, the healthier you eat the better your body will heal. It is important to increasing your protein intake.  This means limiting the foods with sugar and carbohydrates.  Focus on vegetables and some meat.  If you have liposuction during your procedure be sure to drink water.  If your urine is bright yellow, then it is concentrated, and you need to drink more water.  As a general rule after surgery, you should have 8 ounces of water every hour while awake.  If you find you are persistently nauseated or unable to take in liquids let us know.  NO TOBACCO USE or EXPOSURE.  This will slow your healing process and lead to a wound.  WOUND CARE Leave the binder on for 3 days . Use fragrance free soap like Dial, Dove or Ivory.   After 3 days you can remove the binder to shower. Once dry apply binder or sports bra. If you have liposuction you will have a soft and spongy dressing (Lipofoam) that helps prevent creases in your skin.  Remove before you shower and then replace it.  It is also available on [employer]. If you have steri-strips / tape directly attached to your skin leave them in place. It is OK to get these wet.   No baths, pools or hot tubs for four weeks. We close your incision to leave the smallest and best-looking scar. No ointment or creams on your incisions  for four weeks.  No Neosporin (Too many skin reactions).  A few weeks after surgery you can use Mederma and start massaging the scar. We ask you to wear your binder or sports bra for the first 6 weeks around the clock, including while sleeping. This provides added comfort and helps reduce the fluid accumulation at the surgery site. NO Ice or heating pads to the operative site.  You have a very high risk of a BURN before you feel the temperature change.  ACTIVITY No heavy lifting until cleared by the doctor.  This usually means no more than a half-gallon of milk.  It is OK to walk and climb stairs. Moving your legs is very important to decrease your risk of a blood clot.  It will also help keep you from getting deconditioned.  Every 1 to 2 hours get up and walk for 5 minutes. This will help with a quicker recovery back to normal.  Let pain be your guide so you don't do too much.  This time is for you to recover.  You will be more comfortable if you sleep and rest with your head elevated either with a few pillows under you or in a recliner.  No stomach sleeping for a three months.  WORK Everyone returns to work at different times. As a rough guide, most people take at least 1 - 2 weeks off prior to returning to work. If   you need documentation for your job, give the forms to the front staff at the clinic.  DRIVING Arrange for someone to bring you home from the hospital after your surgery.  You may be able to drive a few days after surgery but not while taking any narcotics or valium.  BOWEL MOVEMENTS Constipation can occur after anesthesia and while taking pain medication.  It is important to stay ahead for your comfort.  We recommend taking Milk of Magnesia (2 tablespoons; twice a day) while taking the pain pills.  MEDICATIONS You may be prescribed should start after surgery At your preoperative visit for you history and physical you may have been given the following medications: An antibiotic: Start  this medication when you get home and take according to the instructions on the bottle. Zofran 4 mg:  This is to treat nausea and vomiting.  You can take this every 6 hours as needed and only if needed. Valium 2 mg for breast cancer patients: This is for muscle tightness if you have an implant or expander. This will help relax your muscle which also helps with pain control.  This can be taken every 12 hours as needed. Don't drive after taking this medication. Norco (hydrocodone/acetaminophen) 5/325 mg:  This is only to be used after you have taken the Motrin or the Tylenol. Every 8 hours as needed.   Over the counter Medication to take: Ibuprofen (Motrin) 600 mg:  Take this every 6 hours.  If you have additional pain then take 500 mg of the Tylenol every 8 hours.  Only take the Norco after you have tried these two. MiraLAX or Milk of Magnesia: Take this according to the bottle if you take the [place] Call your surgeon's office if any of the following occur: Fever 101 degrees F or greater Excessive bleeding or fluid from the incision site. Pain that increases over time without aid from the medications Redness, warmth, or pus draining from incision sites Persistent nausea or inability to take in liquids Severe misshapen area that underwent the operation.   Post Anesthesia Home Care Instructions  Activity: Get plenty of rest for the remainder of the day. A responsible individual must stay with you for 24 hours following the procedure.  For the next 24 hours, DO NOT: -Drive a car -Paediatric nurse -Drink alcoholic beverages -Take any medication unless instructed by your physician -Make any legal decisions or sign important papers.  Meals: Start with liquid foods such as gelatin or soup. Progress to regular foods as tolerated. Avoid greasy, spicy, heavy foods. If nausea and/or vomiting occur, drink only clear liquids until the nausea and/or vomiting subsides. Call your  physician if vomiting continues.  Special Instructions/Symptoms: Your throat may feel dry or sore from the anesthesia or the breathing tube placed in your throat during surgery. If this causes discomfort, gargle with warm salt water. The discomfort should disappear within 24 hours.  If you had a scopolamine patch placed behind your ear for the management of post- operative nausea and/or vomiting:  1. The medication in the patch is effective for 72 hours, after which it should be removed.  Wrap patch in a tissue and discard in the trash. Wash hands thoroughly with soap and water. 2. You may remove the patch earlier than 72 hours if you experience unpleasant side effects which may include dry mouth, dizziness or visual disturbances. 3. Avoid touching the patch. Wash your hands with soap and water after contact with the  patch.    About my Jackson-Pratt Bulb Drain  What is a Jackson-Pratt bulb? A Jackson-Pratt is a soft, round device used to collect drainage. It is connected to a long, thin drainage catheter, which is held in place by one or two small stiches near your surgical incision site. When the bulb is squeezed, it forms a vacuum, forcing the drainage to empty into the bulb.  Emptying the Jackson-Pratt bulb- To empty the bulb: 1. Release the plug on the top of the bulb. 2. Pour the bulb's contents into a measuring container which your nurse will provide. 3. Record the time emptied and amount of drainage. Empty the drain(s) as often as your     doctor or nurse recommends.  Date                  Time                    Amount (Drain 1)                 Amount (Drain  2)  _____________________________________________________________________  _____________________________________________________________________  _____________________________________________________________________  _____________________________________________________________________  _____________________________________________________________________  _____________________________________________________________________  _____________________________________________________________________  _____________________________________________________________________  Squeezing the Jackson-Pratt Bulb- To squeeze the bulb: 1. Make sure the plug at the top of the bulb is open. 2. Squeeze the bulb tightly in your fist. You will hear air squeezing from the bulb. 3. Replace the plug while the bulb is squeezed. 4. Use a safety pin to attach the bulb to your clothing. This will keep the catheter from     pulling at the bulb insertion site.  When to call your doctor- Call your doctor if: Drain site becomes red, swollen or hot. You have a fever greater than 101 degrees F. There is oozing at the drain site. Drain falls out (apply a guaze bandage over the drain hole and secure it with tape). Drainage increases daily not related to activity patterns. (You will usually have more drainage when you are active than when you are resting.) Drainage has a bad odor.  Information for Discharge Teaching: EXPAREL (bupivacaine liposome injectable suspension)   Your surgeon or anesthesiologist gave you EXPAREL(bupivacaine) to help control your pain after surgery.  EXPAREL is a local anesthetic that provides pain relief by numbing the tissue around the surgical site. EXPAREL is designed to release pain medication over time and can control pain for up to 72 hours. Depending on how you respond to EXPAREL, you may require less pain medication during your recovery.  Possible side effects: Temporary loss of  sensation or ability to move in the area where bupivacaine was injected. Nausea, vomiting, constipation Rarely, numbness and tingling in your mouth or lips, lightheadedness, or anxiety may occur. Call your doctor right away if you think you may be experiencing any of these sensations, or if you have other questions regarding possible side effects.  Follow all other discharge instructions given to you by your surgeon or nurse. Eat a healthy diet and drink plenty of water or other fluids.  If you return to the hospital for any reason within 96 hours following the administration of EXPAREL, it is important for health care providers to know that you have received this anesthetic. A teal colored band has been placed on your arm with the date, time and amount of EXPAREL you have received in order to alert and inform your health care providers. Please leave this armband in place for the full 96 hours following administration, and  then you may remove the band.  Next dose of tylenol due after 5pm

## 2022-07-23 NOTE — Transfer of Care (Signed)
Immediate Anesthesia Transfer of Care Note  Patient: Barbara Thornton  Procedure(s) Performed: BREAST REDUCTION WITH LIPOSUCTION (Bilateral: Breast)  Patient Location: PACU  Anesthesia Type:General  Level of Consciousness: awake, alert , and oriented  Airway & Oxygen Therapy: Patient Spontanous Breathing and Patient connected to face mask oxygen  Post-op Assessment: Report given to RN and Post -op Vital signs reviewed and stable  Post vital signs: Reviewed and stable  Last Vitals:  Vitals Value Taken Time  BP 110/72 07/23/22 1304  Temp    Pulse 75 07/23/22 1311  Resp 11 07/23/22 1312  SpO2 100 % 07/23/22 1311  Vitals shown include unvalidated device data.  Last Pain:  Vitals:   07/23/22 0926  TempSrc: Oral      Patients Stated Pain Goal: 6 (07/23/22 0926)  Complications: No notable events documented.

## 2022-07-23 NOTE — Anesthesia Procedure Notes (Signed)
Procedure Name: LMA Insertion Date/Time: 07/23/2022 10:55 AM  Performed by: Cleda Clarks, CRNAPre-anesthesia Checklist: Patient identified, Emergency Drugs available, Suction available and Patient being monitored Patient Re-evaluated:Patient Re-evaluated prior to induction Oxygen Delivery Method: Circle system utilized Preoxygenation: Pre-oxygenation with 100% oxygen Induction Type: IV induction Ventilation: Mask ventilation without difficulty LMA: LMA inserted LMA Size: 4.0 Number of attempts: 1 Placement Confirmation: positive ETCO2 Tube secured with: Tape Dental Injury: Teeth and Oropharynx as per pre-operative assessment

## 2022-07-23 NOTE — Op Note (Signed)
Breast Reduction Op note:    DATE OF PROCEDURE: 07/23/2022  LOCATION: [facility]  SURGEON: Foster Simpson, DO  ASSISTANT: Keenan Bachelor, PA  PREOPERATIVE DIAGNOSIS 1. Macromastia 2. Neck Pain 3. Back Pain  POSTOPERATIVE DIAGNOSIS 1. Macromastia 2. Neck Pain 3. Back Pain  PROCEDURES 1. Bilateral breast reduction.  Right reduction 990 g, Left reduction 890 g  COMPLICATIONS: None.  DRAINS: bilateral  INDICATIONS FOR PROCEDURE Yatzil Clippinger is a 47 y.o. year-old female born on 03/07/1975,with a history of symptomatic macromastia with concominant back pain, neck pain, shoulder grooving from her bra.   MRN: [id number]  CONSENT Informed consent was obtained directly from the patient. The risks, benefits and alternatives were fully discussed. Specific risks including but not limited to bleeding, infection, hematoma, seroma, scarring, pain, nipple necrosis, asymmetry, poor cosmetic results, and need for further surgery were discussed. The patient's questions were answered.  DESCRIPTION OF PROCEDURE  Patient was brought into the operating room and rested on the operating room table in the supine position.  SCDs were placed and appropriate padding was performed.  Antibiotics were given. The patient underwent general anesthesia and the chest was prepped and draped in a sterile fashion.  A timeout was performed and all information was confirmed to be correct by those in the room. Tumescent was placed in the lateral breast area.  Liposuction was done lateral breast area bilaterally for contour.  Right side: Preoperative markings were confirmed.  Incision lines were injected with local containing epinephrine.  After waiting for vasoconstriction, the marked lines were incised with a #15 blade.  A Wise-pattern superomedial breast reduction was performed by de-epithelializing the pedicle, using bovie to create the superomedial pedicle, and removing breast tissue from  the superior, lateral, and inferior portions of the breast.  Experel and Cellerate were placed. Care was taken to not undermine the breast pedicle. Hemostasis was achieved.  The nipple was gently rotated into position and the soft tissue closed with 4-0 Monocryl.   The pocket was irrigated and hemostasis confirmed.  The deep tissues were approximated with 3-0 PDS sutures.  The skin was closed with deep dermal 3-0 Monocryl and subcuticular 4-0 Monocryl sutures.  The nipple and skin flaps had good capillary refill at the end of the procedure.    Left side: Preoperative markings were confirmed.  Incision lines were injected with local containing epinephrine.  After waiting for vasoconstriction, the marked lines were incised with a #15 blade.  A Wise-pattern superomedial breast reduction was performed by de-epithelializing the pedicle, using bovie to create the superomedial pedicle, and removing breast tissue from the superior, lateral, and inferior portions of the breast.  Care was taken to not undermine the breast pedicle. Hemostasis was achieved.  Experel and cellerate were placed. The nipple was gently rotated into position and the soft tissue was closed with 4-0 Monocryl.  The patient was sat upright and size and shape symmetry was confirmed.  The pocket was irrigated and hemostasis confirmed.  The deep tissues were approximated with 3-0 PDS sutures. The skin was closed with deep dermal 3-0 Monocryl and subcuticular 4-0 Monocryl sutures.  Dermabond was applied.  A breast binder and ABDs were placed.  The nipple and skin flaps had good capillary refill at the end of the procedure.  The patient tolerated the procedure well. The patient was allowed to wake from anesthesia and taken to the recovery room in satisfactory condition.  The advanced practice practitioner (APP) assisted throughout the case.  The  APP was essential in retraction and counter traction when needed to make the case progress smoothly.  This  retraction and assistance made it possible to see the tissue plans for the procedure.  The assistance was needed for blood control, tissue re-approximation and assisted with closure of the incision site.

## 2022-07-23 NOTE — Interval H&P Note (Signed)
History and Physical Interval Note:  07/23/2022 10:13 AM  Barbara Thornton  has presented today for surgery, with the diagnosis of Sympathetic mammary hypertrophy Macromastia Upper back pain.  The various methods of treatment have been discussed with the patient and family. After consideration of risks, benefits and other options for treatment, the patient has consented to  Procedure(s) with comments: BREAST REDUCTION WITH LIPOSUCTION (Bilateral) - 2.5 HOURS NEEDED as a surgical intervention.  The patient's history has been reviewed, patient examined, no change in status, stable for surgery.  I have reviewed the patient's chart and labs.  Questions were answered to the patient's satisfaction.     Alena Bills [name]

## 2022-07-23 NOTE — Anesthesia Preprocedure Evaluation (Addendum)
Anesthesia Evaluation  Patient identified by MRN, date of birth, ID band Patient awake    Reviewed: Allergy & Precautions, H&P , NPO status , Patient's Chart, lab work & pertinent test results  Airway Mallampati: II  TM Distance: >3 FB Neck ROM: Full    Dental no notable dental hx.    Pulmonary former smoker   Pulmonary exam normal breath sounds clear to auscultation       Cardiovascular negative cardio ROS Normal cardiovascular exam Rhythm:Regular Rate:Normal     Neuro/Psych negative neurological ROS  negative psych ROS   GI/Hepatic negative GI ROS, Neg liver ROS,,,  Endo/Other  negative endocrine ROS    Renal/GU negative Renal ROS  negative genitourinary   Musculoskeletal negative musculoskeletal ROS (+)    Abdominal  (+) + obese  Peds negative pediatric ROS (+)  Hematology negative hematology ROS (+)   Anesthesia Other Findings   Reproductive/Obstetrics negative OB ROS                             Anesthesia Physical Anesthesia Plan  ASA: 2  Anesthesia Plan: General   Post-op Pain Management: Dilaudid IV   Induction: Intravenous  PONV Risk Score and Plan: 3 and Ondansetron, Dexamethasone, Midazolam and Treatment may vary due to age or medical condition  Airway Management Planned: LMA  Additional Equipment:   Intra-op Plan:   Post-operative Plan: Extubation in OR  Informed Consent: I have reviewed the patients History and Physical, chart, labs and discussed the procedure including the risks, benefits and alternatives for the proposed anesthesia with the patient or authorized representative who has indicated his/her understanding and acceptance.     Dental advisory given  Plan Discussed with: CRNA  Anesthesia Plan Comments:        Anesthesia Quick Evaluation

## 2022-07-23 NOTE — Anesthesia Postprocedure Evaluation (Signed)
Anesthesia Post Note  Patient: Barbara Thornton  Procedure(s) Performed: BREAST REDUCTION WITH LIPOSUCTION (Bilateral: Breast)     Patient location during evaluation: PACU Anesthesia Type: General Level of consciousness: awake and alert, oriented and patient cooperative Pain management: pain level controlled Vital Signs Assessment: post-procedure vital signs reviewed and stable Respiratory status: spontaneous breathing, nonlabored ventilation and respiratory function stable Cardiovascular status: blood pressure returned to baseline and stable Postop Assessment: no apparent nausea or vomiting Anesthetic complications: no   No notable events documented.  Last Vitals:  Vitals:   07/23/22 1319 07/23/22 1338  BP: 112/76 136/86  Pulse: 73 80  Resp: 11 18  Temp:    SpO2: 100% 100%    Last Pain:  Vitals:   07/23/22 1338  TempSrc:   PainSc: 0-No pain                 Lannie Fields

## 2022-07-24 ENCOUNTER — Encounter (HOSPITAL_BASED_OUTPATIENT_CLINIC_OR_DEPARTMENT_OTHER): Payer: Self-pay | Admitting: Plastic Surgery

## 2022-07-24 LAB — SURGICAL PATHOLOGY

## 2022-07-29 ENCOUNTER — Ambulatory Visit: Payer: No Typology Code available for payment source | Admitting: Plastic Surgery

## 2022-07-29 ENCOUNTER — Encounter: Payer: Self-pay | Admitting: Plastic Surgery

## 2022-07-29 VITALS — BP 155/90 | HR 84

## 2022-07-29 DIAGNOSIS — N62 Hypertrophy of breast: Secondary | ICD-10-CM

## 2022-07-29 NOTE — Progress Notes (Signed)
The patient is a 47 year old female here for follow-up after undergoing bilateral breast reduction June 26.  She had over 850 g removed from both breasts.  She is extremely happy with her results.  I went ahead and removed both drains today.  Her Steri-Strips are in place.  I removed the dressing as well.  Continue with sports bra or binder.  Will get pictures at her next visit and see her back in about 2 weeks.

## 2022-08-12 ENCOUNTER — Encounter: Payer: Self-pay | Admitting: Plastic Surgery

## 2022-08-12 NOTE — Telephone Encounter (Signed)
I spoke with the patient regarding her MyChart question. Patient stated that she was experiencing some drainage from her left breast. She denies any breast tenderness, erythema, warmth, or pain and denies any fevers or chills. Advised the patient to continue to apply gauze and/or ABD pads to the incision daily and change as needed if saturated. Discussed with the patient to continue compression at all times. Patient expressed understanding.   Instructed patient to continue to monitor surgical site. Discussed with her that if she develops any redness, increased drainage, changes in the color/characteristic of her drainage, pain, or develops any fevers or chills to contact us. Also instructed her to call if she has any further questions or concerns.   Patient has a scheduled appointment this Friday and will plan to come in at that time.

## 2022-08-13 NOTE — Progress Notes (Signed)
Patient is a pleasant 47 year old female s/p bilateral breast reduction performed 07/23/2022 by Dr. Ulice Bold who returns to clinic for postoperative follow-up.  Reviewed chart and she was last seen here in clinic on 07/29/2022.  At that time, exam was benign and her drains were removed without complication or difficulty.  However, she messaged the clinic 08/12/2022 with concern for leakage from the right breast incision.  She called and spoke with the PA at the office and she denied any systemic symptoms.  Recommended application of gauze and/or ABD pads as needed for drainage.  Reasonable to hold off until her scheduled postoperative visit.  Today, patient reports she is doing well. She states she is still having some drainage from her left breast. She reports she has to change her dressings about 1-2 x per day. She denies any fevers or chills. She denies any redness or significant pain to her left breast. She denies any other issues at this time.   Chaperone present on exam. On exam, patient is sitting upright in no acute distress. Breasts are soft and fairly symmetric. L breast is slightly more swollen than the right. There is no overlying erythema. There are no significant fluid collection palpated on exam. Very mild tenderness to palpation bilaterally. To the right breast, NAC appears viable. Steri strips were removed and incisions are intact and healing well. Some mild irritation is noted to the skin near medial inframammary fold. To left breast, NAC appears viable. There is a 1 cm x 1.5 cm x 0.75 cm wound to the inferior T zone. There some fibrinous exudate noted to the periphery of the wound. There is no surrounding erythema or malodor. There is no active drainage on exam. No fluid was expressed upon palpation. Remainder of the incisions are intact and healing well. No signs of infection on exam.   Vashe soaked gauze were placed on wound to L breast. These were removed and donated myriad followed by  adaptic, KY jelly and a mepilex border dressing were placed on the wound.   I discussed with the patient that I would like her to leave dressing in place for the next 2 days. I recommended she apply KY jelly 1x/day each day. Discussed with her she may remove dressing to L breast on Monday and transition to either vaseline and gauze or xerform. Patient expressed understanding.   Discussed with patient she may apply vaseline to the remainder of her incisions   Discussed the importance of wearing compression at all times except when showering. Recommended she not get the dressings to her L breast wet for the next few days. Patient expressed understanding.   Patient to follow back up next week.   I instructed her to call in the meantime if she has any questions or concerns.   Pictures were obtained of the patient and placed in the chart with the patient's or guardian's permission.

## 2022-08-15 ENCOUNTER — Ambulatory Visit (INDEPENDENT_AMBULATORY_CARE_PROVIDER_SITE_OTHER): Payer: No Typology Code available for payment source | Admitting: Student

## 2022-08-15 VITALS — BP 153/88 | HR 77

## 2022-08-15 DIAGNOSIS — N62 Hypertrophy of breast: Secondary | ICD-10-CM

## 2022-08-18 NOTE — Progress Notes (Deleted)
Patient is a pleasant 47 year old female s/p bilateral breast reduction performed 07/23/2022 by Dr. Ulice Bold presents to clinic for postoperative follow-up.  She was last seen here in clinic on 08/15/2022.  At that time, she was having drainage from left breast and changing dressings 1-2 times per day.  On exam, 1 x 1.5 x 0.75 cm wound at inferior T zone left breast.  No active drainage appreciated on exam.  No surrounding cellulitic changes.  Donated myriad placed on wound followed by Adaptic, K-Y jelly, and bordered Mepilex dressing.  Recommended K-Y jelly once daily.  Vaseline to remainder of incisions throughout.  Continued activity modifications and compressive garments.  Today,

## 2022-08-19 ENCOUNTER — Encounter: Payer: No Typology Code available for payment source | Admitting: Physician Assistant

## 2022-08-19 ENCOUNTER — Ambulatory Visit (INDEPENDENT_AMBULATORY_CARE_PROVIDER_SITE_OTHER): Payer: No Typology Code available for payment source | Admitting: Student

## 2022-08-19 VITALS — BP 135/91 | HR 82

## 2022-08-19 DIAGNOSIS — N62 Hypertrophy of breast: Secondary | ICD-10-CM

## 2022-08-19 DIAGNOSIS — Z9889 Other specified postprocedural states: Secondary | ICD-10-CM

## 2022-08-19 NOTE — Progress Notes (Signed)
Patient is a pleasant 47 year old female s/p bilateral breast reduction performed 07/23/2022 by Dr. Ulice Bold who returns to clinic for postoperative follow-up.     Patient was last seen in the clinic on 08/15/2022.  At this visit, patient reported she was having some drainage from the left breast.  On exam, there was a 1 cm x 1.5 cm x 0.75 cm wound to the inferior T-zone.  There was some fibrinous exudate noted.  Donated myriad was placed in the wound.  Plan was for patient to apply K-Y jelly to the wound daily and then transition to Vaseline and gauze or Xeroform.  Today, patient reports she is doing well.  She states that her wound has not been draining that much.  She states that she has been changing the dressings 1 time per day.  She denies any increased pain, fevers or chills.  She denies any other concerns.  Chaperone present on exam.  On exam, patient is sitting upright in no acute distress.  Breasts are soft and symmetric.  There is no overlying erythema to either breast.  NAC's are viable bilaterally.  There are no obvious fluid collections palpated on exam.  Medially to the breast bilaterally, there is a little bit of firmness that appears to be consistent with some scar tissue.  To the right breast, all incisions are intact and healing well.  To the left breast, there is a 2 cm x 2 cm x 0.5 cm wound to the inferior T-zone.  There is no active drainage on exam.  There is no sign of infection on exam.  Incisions to the left breast are otherwise intact and healing well.  Discussed with patient that I would like her to apply Xeroform and dressings daily to the wound to her left breast.  Discussed with her she may apply Vaseline to incisions otherwise throughout.  Patient expressed understanding.  Discussed with patient that I could continue compression at all times.  We will have the patient follow back up in 1 week.  Instructed to call in the meantime she has any questions or  concerns.  Pictures were obtained of the patient and placed in the chart with the patient's or guardian's permission.

## 2022-08-20 ENCOUNTER — Telehealth: Payer: Self-pay

## 2022-08-20 NOTE — Telephone Encounter (Signed)
Received correspondence from [employer] stating that they have provided service for the patient; no further action is required.   Forwarding order and correspondence to front desk for batch scanning.

## 2022-08-20 NOTE — Telephone Encounter (Signed)
Faxed wound supply order to [facility] including demographics, insurance card and most recent ov note. Received fax success confirmation.

## 2022-08-27 NOTE — Progress Notes (Signed)
Patient is a pleasant 47 year old female s/p bilateral breast reduction performed 07/23/2022 Dr. Ulice Bold returns to clinic for postoperative follow-up.  She was last seen here in clinic on 08/19/2022.  At that time, 2 x 2 x 0.5 cm wound noted to inferior T zone left breast.  No drainage.  No evidence concerning for infection.  Recommended continued Xeroform dressings to the wound and Vaseline to the remainder of her incisions throughout.  Follow-up in 1 week.  Today, patient is doing well.  She states that her drainage has slowed and her wound has improved since recent encounter.  She has been applying Xeroform followed by bordered Mepilex dressing which she obtained from [facility].  She is otherwise doing well.  She states that the back and neck discomfort that she experienced prior to surgery has been significantly alleviated since day 1 postop from surgery.  She is quite pleased with the outcome.  On exam, breasts have excellent shape and symmetry.  NAC's are healthy and viable.  Slight hyperpigmentation where areolar suture knots have been placed, but anticipate them to return to normal.  Breasts are soft throughout.  Small, 1.75 x 1.75 x 0.25 cm inferior T zone wound on the left breast with excellent granulation tissue at base of wound.  No significant drainage or tunneling appreciated.  Recommend continued wound care to the inferior T zone wound.  Can apply simple Vaseline to the incisions throughout.  Will transition to silicone scar gel at next encounter.  Follow-up in 2 to 3 weeks for reevaluation.  Picture(s) obtained of the patient and placed in the chart were with the patient's or guardian's permission.

## 2022-08-29 ENCOUNTER — Ambulatory Visit (INDEPENDENT_AMBULATORY_CARE_PROVIDER_SITE_OTHER): Payer: No Typology Code available for payment source | Admitting: Physician Assistant

## 2022-08-29 ENCOUNTER — Encounter: Payer: Self-pay | Admitting: Physician Assistant

## 2022-08-29 VITALS — BP 161/86 | HR 77

## 2022-08-29 DIAGNOSIS — Z9889 Other specified postprocedural states: Secondary | ICD-10-CM

## 2022-09-18 NOTE — Progress Notes (Signed)
Patient is a pleasant 47 year old female s/p bilateral breast reduction performed 07/23/2022 Dr. Ulice Bold returns to clinic for postoperative follow-up.   She was last seen here in clinic on 08/30/2022.  At that time, left breast inferior T zone wound measuring 1.75 x 1.75 x 0.25 cm.  No significant drainage or tunneling appreciated.  Otherwise, exam benign.  Vaseline to incisions throughout.  Transition to silicone scar gel at next encounter.  Follow-up in 2 to 3 weeks.  Today, patient is doing well.  She states that the wound has made considerable progress since last encounter.  Denies any significant drainage or depth.  She has been using Xeroform followed by bordered Mepilex dressing.  Inquires about ongoing restrictions.  No other complaints.  Pleased with outcome.  On exam, wound now measures 1 x 1.25 cm, no depth.  Excellent granulation tissue.  Advanced epithelialization since last encounter.  No surrounding erythema or induration.  Nontender.  Breasts are otherwise soft throughout.  Good shape and symmetry.  NAC's are healthy and viable.  Scars are thin.  At this time, increase activity as tolerated.  Continue avoid submersion of breast in water until her wound is fully healed.  Discussed transition to silicone scar gels everywhere except for her small wound which suspect will be healed in the next couple of weeks.  Continue with Vaseline gauze in the interim.  Follow-up as needed.  Picture(s) obtained of the patient and placed in the chart were with the patient's or guardian's permission.   Today's Vitals   09/19/22 1432  BP: (!) 172/103  Pulse: 78  SpO2: 96%  Weight: 213 lb 6.4 oz (96.8 kg)  Height: 5\' 2"  (1.575 m)   Body mass index is 39.03 kg/m.  Patient states that her blood pressure is simply result of feeling excited about today's encounter.  She states that every time she leaves the office and goes home and checks her blood pressure it is normal.  Her daughter who is a CNA  checks it for her.  She denies any cardiac symptoms.  Regardless, we will encourage that she verify the veracity of her sphygmomanometer and follow-up with PCP.

## 2022-09-19 ENCOUNTER — Ambulatory Visit (INDEPENDENT_AMBULATORY_CARE_PROVIDER_SITE_OTHER): Payer: No Typology Code available for payment source | Admitting: Physician Assistant

## 2022-09-19 ENCOUNTER — Encounter: Payer: Self-pay | Admitting: Physician Assistant

## 2022-09-19 VITALS — BP 154/93 | HR 76 | Ht 62.0 in | Wt 213.4 lb

## 2022-09-19 DIAGNOSIS — Z9889 Other specified postprocedural states: Secondary | ICD-10-CM

## 2022-10-21 IMAGING — MG MM DIGITAL SCREENING BILAT W/ TOMO AND CAD
6 of 12 series · 6 of 36 positions shown · non-contrast
Comparison: Previous exam(s).

ACR Breast Density Category a: The breast tissue is almost entirely
fatty.

CLINICAL DATA: Screening.

EXAM:
DIGITAL SCREENING BILATERAL MAMMOGRAM WITH TOMOSYNTHESIS AND CAD
TECHNIQUE: Bilateral screening digital craniocaudal and mediolateral oblique
mammograms were obtained. Bilateral screening digital breast
tomosynthesis was performed. The images were evaluated with
computer-aided detection.

[L MLO synth-2D (1 of 2)]
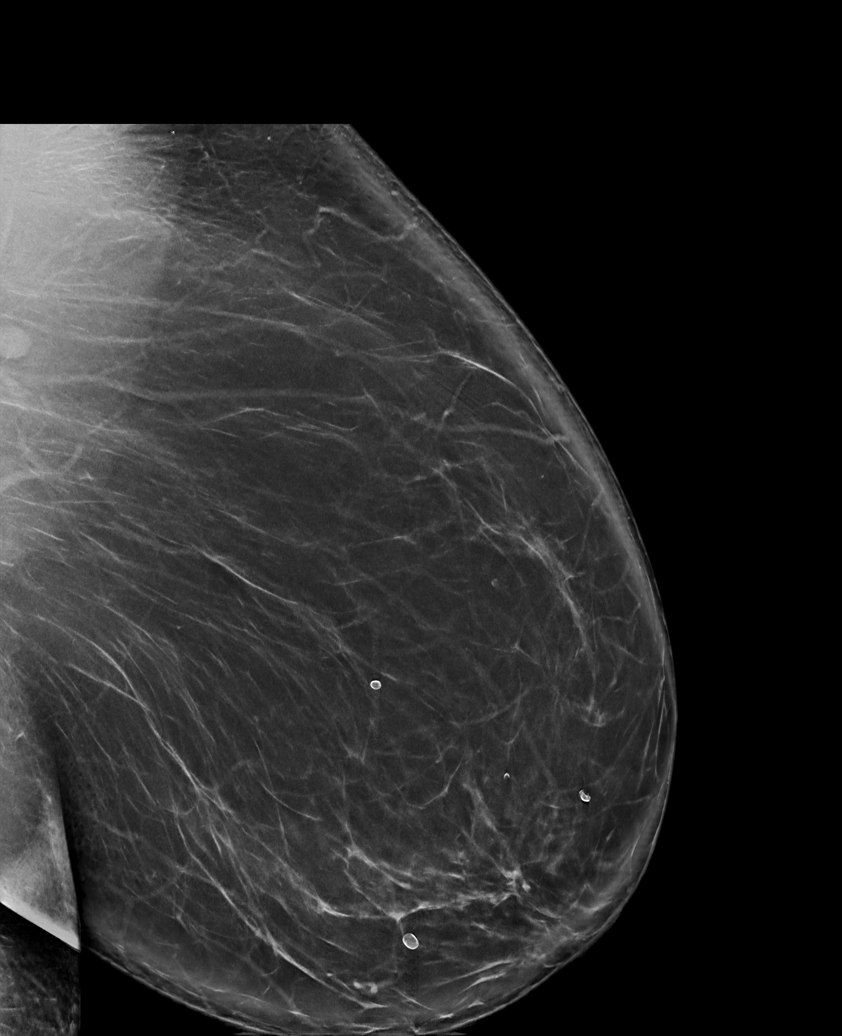

[L MLO synth-2D (2 of 2)]
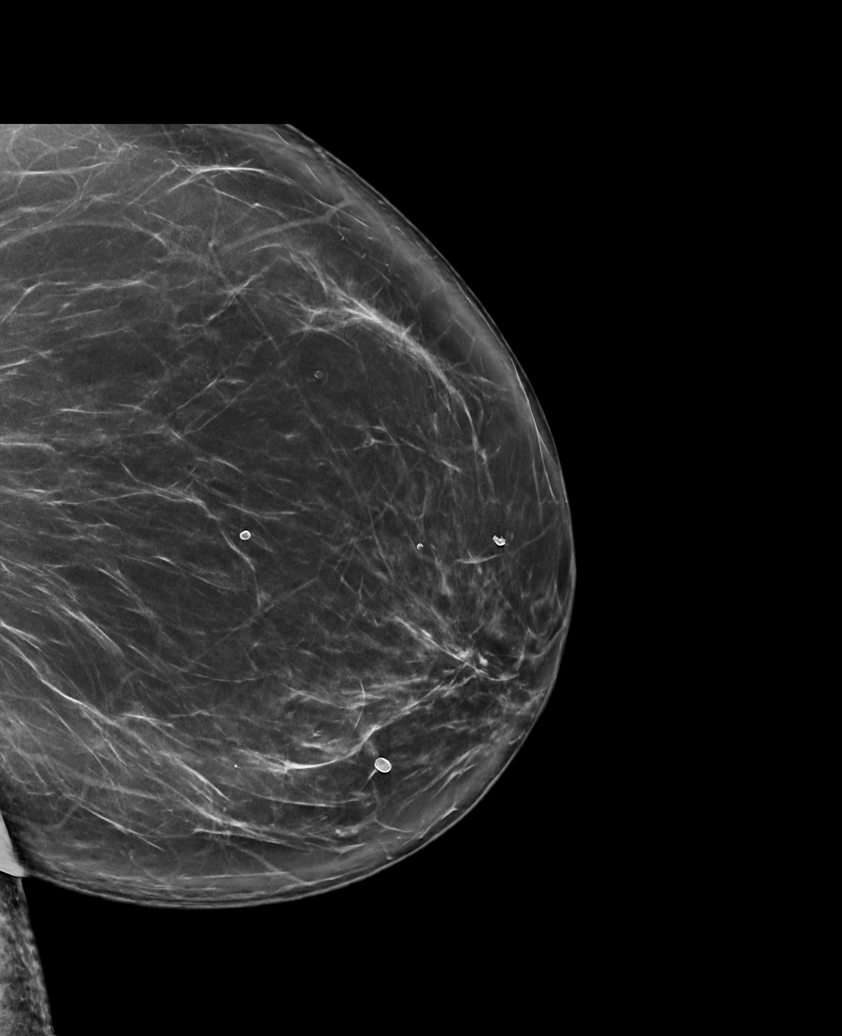

[R MLO synth-2D (1 of 2)]
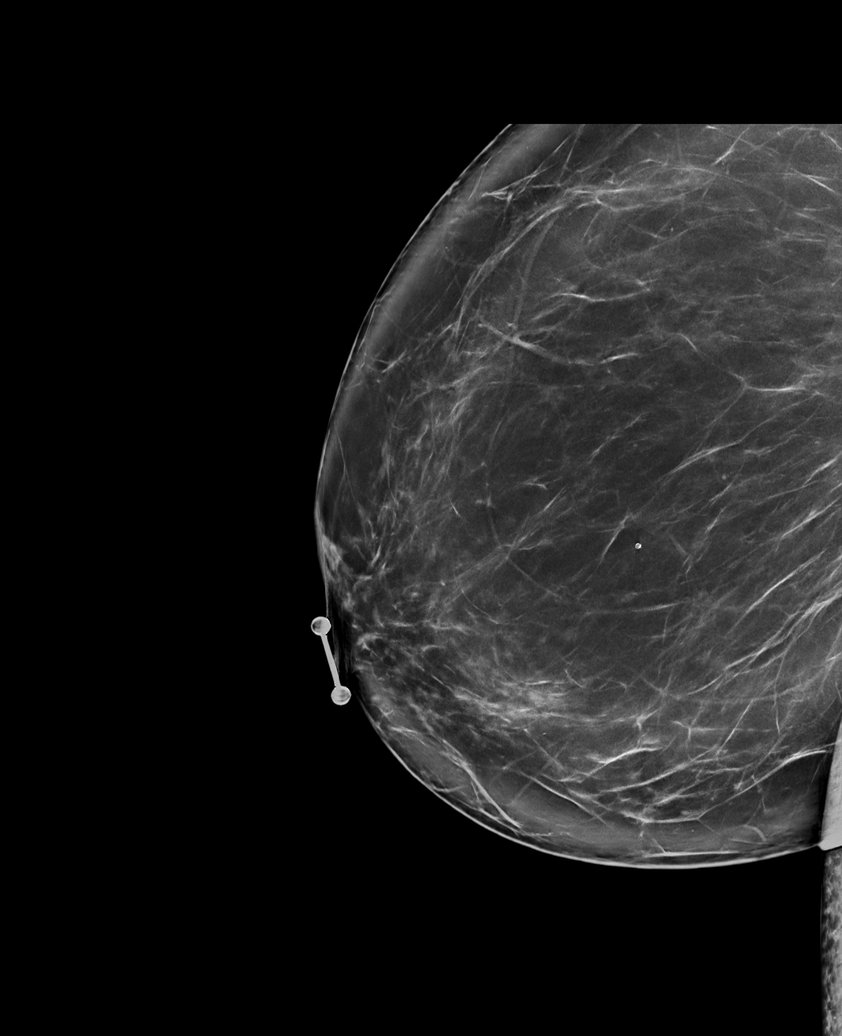

[L CC synth-2D]
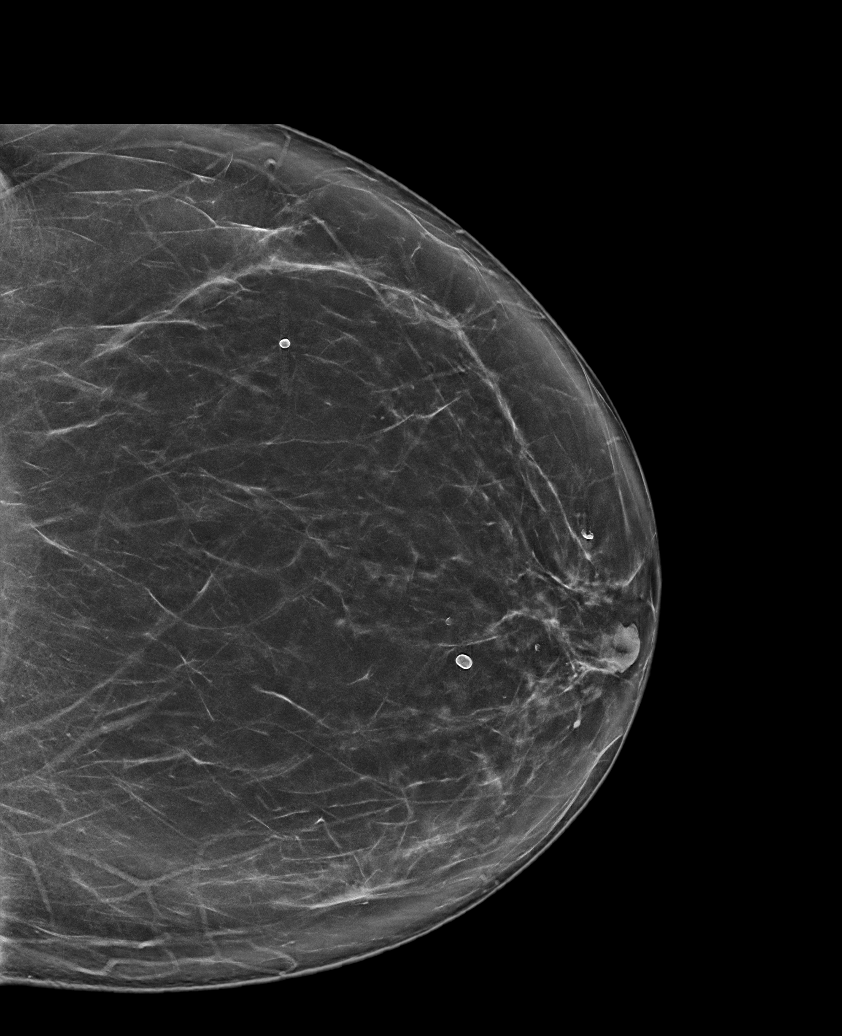

[R MLO synth-2D (2 of 2)]
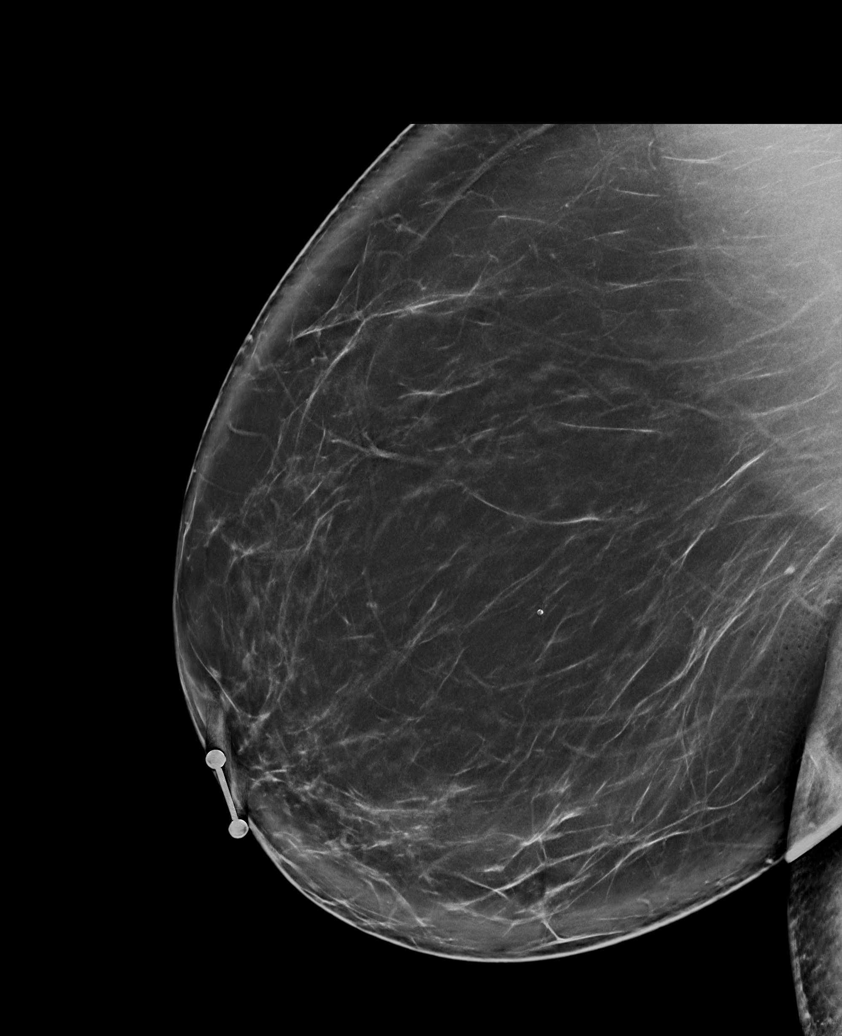

[R CC synth-2D]
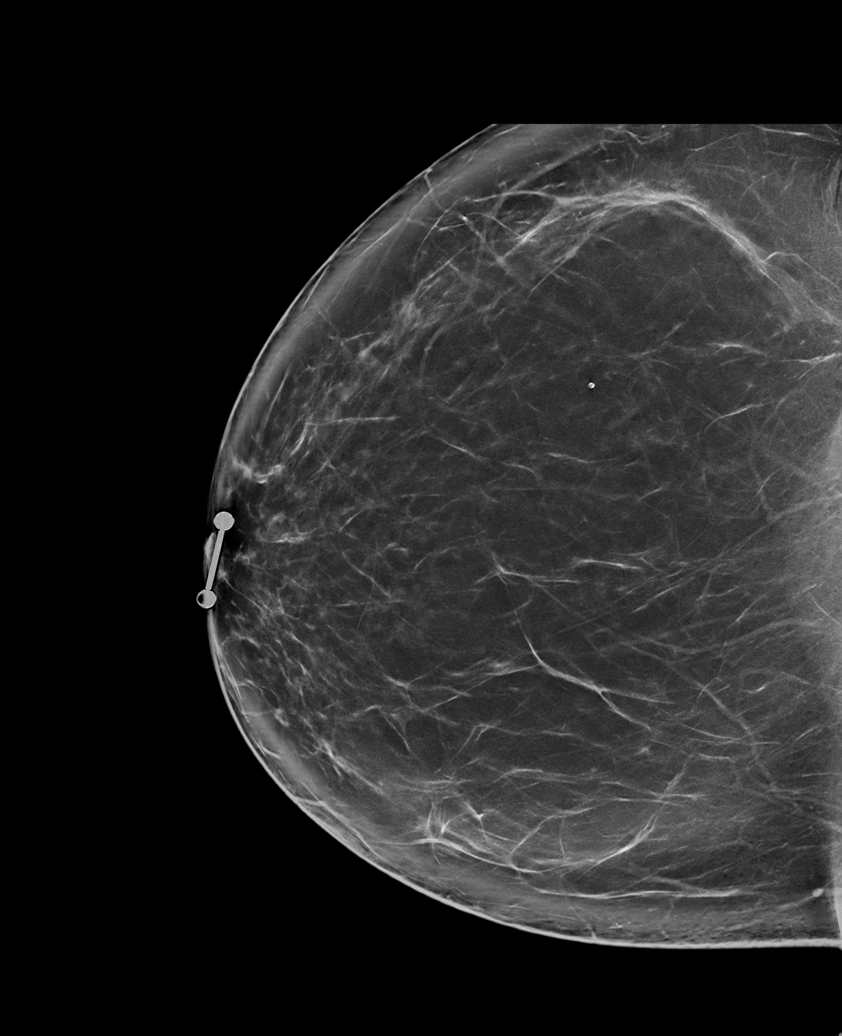

[6 of 36 positions shown; findings below may reference images not displayed]

FINDINGS: There are no findings suspicious for malignancy.
IMPRESSION: No mammographic evidence of malignancy. A result letter of this
screening mammogram will be mailed directly to the patient.

RECOMMENDATION:
Screening mammogram in one year. (Code:0E-3-N98)

BI-RADS CATEGORY  1: Negative.

## 2023-01-09 ENCOUNTER — Emergency Department (HOSPITAL_COMMUNITY): Payer: No Typology Code available for payment source

## 2023-01-09 ENCOUNTER — Emergency Department (HOSPITAL_COMMUNITY)
Admission: EM | Admit: 2023-01-09 | Discharge: 2023-01-09 | Disposition: A | Discharge status: Home / Self Care | Payer: No Typology Code available for payment source | Attending: Emergency Medicine | Admitting: Emergency Medicine

## 2023-01-09 ENCOUNTER — Encounter (HOSPITAL_COMMUNITY): Payer: Self-pay

## 2023-01-09 ENCOUNTER — Other Ambulatory Visit: Payer: Self-pay

## 2023-01-09 DIAGNOSIS — M549 Dorsalgia, unspecified: Secondary | ICD-10-CM

## 2023-01-09 DIAGNOSIS — M545 Low back pain, unspecified: Secondary | ICD-10-CM | POA: Insufficient documentation

## 2023-01-09 DIAGNOSIS — E119 Type 2 diabetes mellitus without complications: Secondary | ICD-10-CM | POA: Insufficient documentation

## 2023-01-09 DIAGNOSIS — Y92410 Unspecified street and highway as the place of occurrence of the external cause: Secondary | ICD-10-CM | POA: Insufficient documentation

## 2023-01-09 DIAGNOSIS — M25511 Pain in right shoulder: Secondary | ICD-10-CM | POA: Insufficient documentation

## 2023-01-09 MED ORDER — KETOROLAC TROMETHAMINE 15 MG/ML IJ SOLN
15.0000 mg | Freq: Once | INTRAMUSCULAR | Status: AC
  Administered 2023-01-09: 15 mg via INTRAMUSCULAR
  Filled 2023-01-09: qty 1

## 2023-01-09 MED ORDER — MELOXICAM 15 MG PO TABS: 15.0000 mg | ORAL_TABLET | Freq: Every day | ORAL | 0 refills | Status: AC
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
## 2023-01-09 NOTE — Discharge Instructions (Addendum)
Your images today were reassuring.  You may use the sling for comfort.  I have prescribed meloxicam which is to be taken daily.  Do not take NSAIDs while taking this medication.  You may continue to take Tylenol as needed.  Schedule a follow-up with orthopedic surgery for further evaluation as needed.
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
## 2023-02-25 ENCOUNTER — Encounter (HOSPITAL_COMMUNITY): Payer: Self-pay | Admitting: *Deleted

## 2023-02-25 ENCOUNTER — Other Ambulatory Visit: Payer: Self-pay

## 2023-02-25 ENCOUNTER — Emergency Department (HOSPITAL_COMMUNITY)
Admission: EM | Admit: 2023-02-25 | Discharge: 2023-02-25 | Disposition: A | Discharge status: Home / Self Care | Payer: No Typology Code available for payment source | Attending: Emergency Medicine | Admitting: Emergency Medicine

## 2023-02-25 DIAGNOSIS — M25511 Pain in right shoulder: Secondary | ICD-10-CM | POA: Insufficient documentation

## 2023-02-25 DIAGNOSIS — E119 Type 2 diabetes mellitus without complications: Secondary | ICD-10-CM | POA: Insufficient documentation

## 2023-02-25 MED ORDER — METHOCARBAMOL 500 MG PO TABS: 500.0000 mg | ORAL_TABLET | Freq: Two times a day (BID) | ORAL | 0 refills | Status: AC

## 2023-02-25 MED ORDER — KETOROLAC TROMETHAMINE 15 MG/ML IJ SOLN: 15.0000 mg | Freq: Once | INTRAMUSCULAR | Status: DC
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
## 2023-02-25 NOTE — ED Triage Notes (Signed)
The pt is c/o rt arm pain for 2 days no recent injury she has had rt shoulder pain also    lmp  iud
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
## 2023-02-25 NOTE — Discharge Instructions (Addendum)
It was a pleasure caring for you today. Please call orthopedics first thing in the morning for follow up. Seek emergency care if experiencing any new or worsening symptoms.  Alternating between 650 mg Tylenol and 400 mg Advil: The best way to alternate taking Acetaminophen (example Tylenol) and Ibuprofen (example Advil/Motrin) is to take them 3 hours apart. For example, if you take ibuprofen at 6 am you can then take Tylenol at 9 am. You can continue this regimen throughout the day, making sure you do not exceed the recommended maximum dose for each drug.

## 2023-05-05 ENCOUNTER — Ambulatory Visit: Admitting: Family Medicine

## 2023-05-05 ENCOUNTER — Encounter: Payer: Self-pay | Admitting: Family Medicine

## 2023-05-05 VITALS — BP 144/92 | HR 73 | Temp 98.6°F | Ht 62.0 in | Wt 220.4 lb

## 2023-05-05 DIAGNOSIS — E1165 Type 2 diabetes mellitus with hyperglycemia: Secondary | ICD-10-CM | POA: Diagnosis not present

## 2023-05-05 DIAGNOSIS — I10 Essential (primary) hypertension: Secondary | ICD-10-CM | POA: Diagnosis not present

## 2023-05-05 MED ORDER — TIRZEPATIDE 7.5 MG/0.5ML ~~LOC~~ SOAJ
7.5000 mg | SUBCUTANEOUS | 0 refills | Status: DC
Start: 2023-06-30 — End: 2023-06-05

## 2023-05-05 MED ORDER — TIRZEPATIDE 2.5 MG/0.5ML ~~LOC~~ SOAJ
2.5000 mg | SUBCUTANEOUS | 0 refills | Status: AC
Start: 2023-05-05 — End: 2023-06-02

## 2023-05-05 MED ORDER — TIRZEPATIDE 5 MG/0.5ML ~~LOC~~ SOAJ
5.0000 mg | SUBCUTANEOUS | 0 refills | Status: DC
Start: 2023-06-02 — End: 2023-06-05

## 2023-05-05 MED ORDER — OLMESARTAN MEDOXOMIL 20 MG PO TABS
20.0000 mg | ORAL_TABLET | Freq: Every day | ORAL | 2 refills | Status: DC
Start: 2023-05-05 — End: 2023-07-01

## 2023-05-05 NOTE — Progress Notes (Signed)
 Subjective:   Chief Complaint  Patient presents with   Medical Management of Chronic Issues    Patient presents today for a 11 month follow-up    Barbara Thornton is a 48 y.o. female here for follow-up of diabetes.   Barbara Thornton does not routinely monitor her sugars.  Patient does not require insulin.   Medications include: diet controlled Diet is healthy.  Exercise: cardio, strength training Despite healthy diet and routine physical activity, she is struggling with her weight.  Elevated blood pressure Patient historically has had elevated blood pressures. She does monitor home blood pressures. Blood pressures ranging on average from 140's/80's. She is not currently taking any medication. Mom was diagnosed with high blood pressure around her age. Diet/exercise as above. No chest pain or shortness of breath.  Past Medical History:  Diagnosis Date   Anemia    DVT (deep venous thrombosis) (HCC) 01/07/2016   right leg, from BCP's then blood thinners used, now IUD to prevent clotting   Hx of adenomatous colonic polyps 05/23/2021   2 diminutive adenomas-recall 2030     Related testing: Retinal exam: Done Pneumovax: done  Objective:  BP (!) 144/92   Pulse 73   Temp 98.6 F (37 C)   Ht 5\' 2"  (1.575 m)   Wt 220 lb 6.4 oz (100 kg)   SpO2 97%   BMI 40.31 kg/m  General:  Well developed, well nourished, in no apparent distress Skin:  Warm, no pallor or diaphoresis Head:  Normocephalic, atraumatic Eyes:  Pupils equal and round, sclera anicteric without injection  Lungs:  CTAB, no access msc use Cardio:  RRR, no bruits, no LE edema Musculoskeletal:  Symmetrical muscle groups noted without atrophy or deformity Neuro:  Sensation intact to pinprick on feet Psych: Age appropriate judgment and insight  Assessment:   Type 2 diabetes mellitus with hyperglycemia, without long-term current use of insulin (HCC) - Plan: tirzepatide (MOUNJARO) 2.5 MG/0.5ML Pen, tirzepatide (MOUNJARO) 5  MG/0.5ML Pen, tirzepatide (MOUNJARO) 7.5 MG/0.5ML Pen, Comprehensive metabolic panel with GFR, Hemoglobin A1c, Lipid panel, Microalbumin / creatinine urine ratio  Morbid obesity (HCC) - Plan: Amb Ref to Medical Weight Management  Essential hypertension - Plan: Basic metabolic panel, olmesartan (BENICAR) 20 MG tablet   Plan:   Chronic, unsure if controlled.  Check labs.  Start Mounjaro 2.5 mg weekly and titrate up.  She will let me know if there are cost issues.  Counseled on diet and exercise. Hopefully Mounjaro helps.  Refer to medical weight loss team. Chronic, not controlled.  Start olmesartan 20 mg daily.  She has an IUD and has no plans of getting pregnant.  Monitor blood pressure at home.  Recheck BMP in 7 days.  I will see her for this in 1 month. The patient voiced understanding and agreement to the plan.  Jilda Roche [facility], DO 05/05/23 4:45 PM

## 2023-05-05 NOTE — Patient Instructions (Signed)
Give us 2-3 business days to get the results of your labs back.   Keep the diet clean and stay active.  If you do not hear anything about your referral in the next 1-2 weeks, call our office and ask for an update.  Let us know if you need anything. 

## 2023-05-06 ENCOUNTER — Encounter (INDEPENDENT_AMBULATORY_CARE_PROVIDER_SITE_OTHER): Payer: Self-pay

## 2023-05-06 ENCOUNTER — Encounter: Payer: Self-pay | Admitting: Family Medicine

## 2023-05-06 ENCOUNTER — Other Ambulatory Visit: Payer: Self-pay | Admitting: Family Medicine

## 2023-05-06 ENCOUNTER — Telehealth: Payer: Self-pay | Admitting: Pharmacy Technician

## 2023-05-06 ENCOUNTER — Other Ambulatory Visit (HOSPITAL_COMMUNITY): Payer: Self-pay

## 2023-05-06 LAB — COMPREHENSIVE METABOLIC PANEL WITH GFR
ALT: 20 U/L (ref 0–35)
AST: 14 U/L (ref 0–37)
Albumin: 4.3 g/dL (ref 3.5–5.2)
Alkaline Phosphatase: 57 U/L (ref 39–117)
BUN: 16 mg/dL (ref 6–23)
CO2: 28 meq/L (ref 19–32)
Calcium: 9.5 mg/dL (ref 8.4–10.5)
Chloride: 101 meq/L (ref 96–112)
Creatinine, Ser: 0.77 mg/dL (ref 0.40–1.20)
GFR: 91.78 mL/min (ref >60.00)
Glucose, Bld: 104 mg/dL — ABNORMAL HIGH (ref 70–99)
Potassium: 3.8 meq/L (ref 3.5–5.1)
Sodium: 138 meq/L (ref 135–145)
Total Bilirubin: 0.4 mg/dL (ref 0.2–1.2)
Total Protein: 7.3 g/dL (ref 6.0–8.3)

## 2023-05-06 LAB — MICROALBUMIN / CREATININE URINE RATIO
Creatinine,U: 125.4 mg/dL
Microalb Creat Ratio: UNDETERMINED mg/g (ref 0.0–30.0)
Microalb, Ur: <0.7 mg/dL

## 2023-05-06 LAB — LIPID PANEL
Cholesterol: 139 mg/dL (ref 0–200)
HDL: 42.3 mg/dL (ref >39.00)
LDL Cholesterol: 76 mg/dL (ref 0–99)
NonHDL: 96.44
Total CHOL/HDL Ratio: 3
Triglycerides: 102 mg/dL (ref 0.0–149.0)
VLDL: 20.4 mg/dL (ref 0.0–40.0)

## 2023-05-06 LAB — HEMOGLOBIN A1C: Hgb A1c MFr Bld: 6.9 % — ABNORMAL HIGH (ref 4.6–6.5)

## 2023-05-06 MED ORDER — METFORMIN HCL 500 MG PO TABS
ORAL_TABLET | ORAL | 0 refills | Status: DC
Start: 2023-05-06 — End: 2023-06-03

## 2023-05-06 NOTE — Telephone Encounter (Signed)
 Pharmacy Patient Advocate Encounter   Received notification from CoverMyMeds that prior authorization for Mounjaro 2.5MG /0.5ML auto-injectors is required/requested.   Insurance verification completed.   The patient is insured through [employer] [facility] .   Per test claim: PA required; PA submitted to above mentioned insurance via CoverMyMeds Key/confirmation #/EOC [id number] Status is pending

## 2023-05-06 NOTE — Telephone Encounter (Signed)
 Pharmacy Patient Advocate Encounter  Received notification from [employer] [facility] that Prior Authorization for  [facility] 2.5MG /0.5ML auto-injectors  has been DENIED.  Full denial letter will be uploaded to the media tab. See denial reason below.  PA #/Case ID/Reference #: [id number]

## 2023-06-03 ENCOUNTER — Other Ambulatory Visit: Payer: Self-pay | Admitting: Family Medicine

## 2023-06-05 ENCOUNTER — Ambulatory Visit (INDEPENDENT_AMBULATORY_CARE_PROVIDER_SITE_OTHER): Admitting: Family Medicine

## 2023-06-05 ENCOUNTER — Encounter: Payer: Self-pay | Admitting: Family Medicine

## 2023-06-05 VITALS — BP 130/82 | HR 72 | Temp 98.0°F | Resp 16 | Ht 62.0 in | Wt 212.0 lb

## 2023-06-05 DIAGNOSIS — I10 Essential (primary) hypertension: Secondary | ICD-10-CM

## 2023-06-05 DIAGNOSIS — E1165 Type 2 diabetes mellitus with hyperglycemia: Secondary | ICD-10-CM | POA: Diagnosis not present

## 2023-06-05 MED ORDER — METFORMIN HCL 1000 MG PO TABS
1000.0000 mg | ORAL_TABLET | Freq: Two times a day (BID) | ORAL | 3 refills | Status: AC
Start: 2023-06-05 — End: ?

## 2023-06-05 NOTE — Patient Instructions (Addendum)
 Keep an eye on your blood pressure 1-2 times per week.   Very strong work with your lifestyle changes.   Keep the diet clean and stay active.  Let us  know if you need anything.

## 2023-06-05 NOTE — Progress Notes (Signed)
 Chief Complaint  Patient presents with   Follow-up    Follow up    Subjective Barbara Thornton is a 48 y.o. female who presents for hypertension follow up. She does monitor home blood pressures. Blood pressures ranging from 140's/80's on average. She is compliant with medication- olmesartan  20 mg/d. Patient has these side effects of medication: none She is adhering to a healthy diet overall. Current exercise: lifting wts, cardio She is down 8 lbs intentionally.  No CP or SOB.    Past Medical History:  Diagnosis Date   Anemia    DVT (deep venous thrombosis) (HCC) 01/07/2016   right leg, from BCP's then blood thinners used, now IUD to prevent clotting   Hx of adenomatous colonic polyps 05/23/2021   2 diminutive adenomas-recall 2030    Exam BP 130/82 (BP Location: Left Arm, Cuff Size: Normal)   Pulse 72   Temp 98 F (36.7 C) (Oral)   Resp 16   Ht 5\' 2"  (1.575 m)   Wt 212 lb (96.2 kg)   SpO2 96%   BMI 38.78 kg/m  General:  well developed, well nourished, in no apparent distress Heart: RRR, no bruits, no LE edema Lungs: clear to auscultation, no accessory muscle use Psych: well oriented with normal range of affect and appropriate judgment/insight  Essential hypertension  Type 2 diabetes mellitus with hyperglycemia, without long-term current use of insulin (HCC) - Plan: metFORMIN  (GLUCOPHAGE ) 1000 MG tablet  Chronic, stable. Counseled on diet and exercise. Cont olmesartan  20 mg/d. Monitor BP at home and let me know if it drops <110 SBP.  F/u in 5 mo. The patient voiced understanding and agreement to the plan.  Shellie Dials Emmons, DO 06/05/23  4:05 PM

## 2023-06-09 ENCOUNTER — Ambulatory Visit (INDEPENDENT_AMBULATORY_CARE_PROVIDER_SITE_OTHER): Admitting: Obstetrics and Gynecology

## 2023-06-09 ENCOUNTER — Encounter: Payer: Self-pay | Admitting: Obstetrics and Gynecology

## 2023-06-09 ENCOUNTER — Other Ambulatory Visit (HOSPITAL_COMMUNITY)
Admission: RE | Admit: 2023-06-09 | Discharge: 2023-06-09 | Disposition: A | Discharge status: Home / Self Care | Source: Ambulatory Visit | Attending: Obstetrics and Gynecology | Admitting: Obstetrics and Gynecology

## 2023-06-09 VITALS — BP 116/80 | HR 79 | Ht 62.75 in | Wt 209.0 lb

## 2023-06-09 DIAGNOSIS — Z1331 Encounter for screening for depression: Secondary | ICD-10-CM

## 2023-06-09 DIAGNOSIS — Z01419 Encounter for gynecological examination (general) (routine) without abnormal findings: Secondary | ICD-10-CM

## 2023-06-09 DIAGNOSIS — Z1231 Encounter for screening mammogram for malignant neoplasm of breast: Secondary | ICD-10-CM

## 2023-06-09 NOTE — Progress Notes (Signed)
 48 y.o. y.o. female here for annual exam. No LMP recorded. (Menstrual status: IUD).    [id number] Significant Other Black or African American Not Hispanic or Latino female here for annual exam.  She has a h/o a DVT while on OCP's. She has a mirean IUD, placed in 1/18. She has light monthly cycles x 3-4 days.  Same partner x 2.5 years, not living together. No dyspareunia. Colonoscopy 2023 MMG 2023 referral placed for MMG and counseled on importance. Has had breast reduction. MMG are painful.  Replace mirena  IUD next year 2021 HPV positive. Last pap smear 2023  Body mass index is 37.32 kg/m.     06/09/2023    3:23 PM 06/05/2023    3:45 PM 06/03/2022   10:35 AM  Depression screen PHQ 2/9  Decreased Interest 0 0 0  Down, Depressed, Hopeless 0 0 0  PHQ - 2 Score 0 0 0  Altered sleeping  0 0  Tired, decreased energy  0 0  Change in appetite  0 0  Feeling bad or failure about yourself   0 0  Trouble concentrating  0 0  Moving slowly or fidgety/restless  0 0  Suicidal thoughts  0 0  PHQ-9 Score  0 0  Difficult doing work/chores  Not difficult at all Not difficult at all    Blood pressure 116/80, pulse 79, height 5' 2.75" (1.594 m), weight 209 lb (94.8 kg).     Component Value Date/Time   DIAGPAP  05/15/2021 1054    - Negative for Intraepithelial Lesions or Malignancy (NILM)   DIAGPAP - Benign reactive/reparative changes 05/15/2021 1054   DIAGPAP  05/08/2020 1103    - Negative for Intraepithelial Lesions or Malignancy (NILM)   DIAGPAP - Benign reactive/reparative changes 05/08/2020 1103   HPVHIGH Negative 05/15/2021 1054   HPVHIGH Negative 05/08/2020 1103   HPVHIGH Positive (A) 04/27/2019 0910   ADEQPAP  05/15/2021 1054    Satisfactory for evaluation; transformation zone component PRESENT.   ADEQPAP  05/08/2020 1103    Satisfactory for evaluation; transformation zone component PRESENT.   ADEQPAP  04/27/2019 0910    Satisfactory for evaluation; transformation zone component  PRESENT.    GYN HISTORY:    Component Value Date/Time   DIAGPAP  05/15/2021 1054    - Negative for Intraepithelial Lesions or Malignancy (NILM)   DIAGPAP - Benign reactive/reparative changes 05/15/2021 1054   DIAGPAP  05/08/2020 1103    - Negative for Intraepithelial Lesions or Malignancy (NILM)   DIAGPAP - Benign reactive/reparative changes 05/08/2020 1103   HPVHIGH Negative 05/15/2021 1054   HPVHIGH Negative 05/08/2020 1103   HPVHIGH Positive (A) 04/27/2019 0910   ADEQPAP  05/15/2021 1054    Satisfactory for evaluation; transformation zone component PRESENT.   ADEQPAP  05/08/2020 1103    Satisfactory for evaluation; transformation zone component PRESENT.   ADEQPAP  04/27/2019 0910    Satisfactory for evaluation; transformation zone component PRESENT.    OB History  Gravida Para Term Preterm AB Living  4 2 2  2 2   SAB IAB Ectopic Multiple Live Births  2    2    # Outcome Date GA Lbr Len/2nd Weight Sex Type Anes PTL Lv  4 Term      CS-Unspec   LIV  3 SAB         FD  2 SAB           1 Term      Vag-Spont   LIV  Past Medical History:  Diagnosis Date   Anemia    Diabetes mellitus without complication (HCC)    DVT (deep venous thrombosis) (HCC) 01/07/2016   right leg, from BCP's then blood thinners used, now IUD to prevent clotting   Elevated cholesterol    Hx of adenomatous colonic polyps 05/23/2021   2 diminutive adenomas-recall 2030   Hypertension     Past Surgical History:  Procedure Laterality Date   BREAST REDUCTION SURGERY Bilateral 07/23/2022   Procedure: BREAST REDUCTION WITH LIPOSUCTION;  Surgeon: Thornell Flirt, DO;  Location: [facility];  Service: Plastics;  Laterality: Bilateral;  2.5 HOURS NEEDED   CESAREAN SECTION  1999   epidural   INTRAUTERINE DEVICE INSERTION  01/29/2016   Mirena    TUBAL LIGATION  1999    Current Outpatient Medications on File Prior to Visit  Medication Sig Dispense Refill   levonorgestrel  (MIRENA ) 20  MCG/24HR IUD 1 each by Intrauterine route once.     metFORMIN  (GLUCOPHAGE ) 1000 MG tablet Take 1 tablet (1,000 mg total) by mouth 2 (two) times daily with a meal. 180 tablet 3   olmesartan  (BENICAR ) 20 MG tablet Take 1 tablet (20 mg total) by mouth daily. 30 tablet 2   rosuvastatin  (CRESTOR ) 20 MG tablet Take 1 tablet (20 mg total) by mouth daily. 90 tablet 3   No current facility-administered medications on file prior to visit.    Social History   Socioeconomic History   Marital status: Single    Spouse name: Not on file   Number of children: Not on file   Years of education: Not on file   Highest education level: Some college, no degree  Occupational History   Not on file  Tobacco Use   Smoking status: Former    Current packs/day: 0.00    Average packs/day: 0.5 packs/day for 17.0 years (8.5 ttl pk-yrs)    Types: Cigarettes    Start date: 11/28/1995    Quit date: 11/27/2012    Years since quitting: 10.5   Smokeless tobacco: Never  Vaping Use   Vaping status: Never Used  Substance and Sexual Activity   Alcohol use: Not Currently   Drug use: Not Currently   Sexual activity: Yes    Partners: Male    Birth control/protection: I.U.D., Surgical    Comment: btl  Other Topics Concern   Not on file  Social History Narrative   Not on file   Social Drivers of Health   Financial Resource Strain: Medium Risk (06/02/2022)   Overall Financial Resource Strain (CARDIA)    Difficulty of Paying Living Expenses: Somewhat hard  Food Insecurity: No Food Insecurity (06/02/2022)   Hunger Vital Sign    Worried About Running Out of Food in the Last Year: Never true    Ran Out of Food in the Last Year: Never true  Transportation Needs: No Transportation Needs (06/02/2022)   PRAPARE - Administrator, Civil Service (Medical): No    Lack of Transportation (Non-Medical): No  Physical Activity: Sufficiently Active (06/02/2022)   Exercise Vital Sign    Days of Exercise per Week: 3 days     Minutes of Exercise per Session: 60 min  Stress: No Stress Concern Present (06/02/2022)   [employer] of Occupational Health - Occupational Stress Questionnaire    Feeling of Stress : Not at all  Social Connections: Moderately Isolated (06/02/2022)   Social Connection and Isolation Panel [NHANES]    Frequency of Communication with Friends and  Family: More than three times a week    Frequency of Social Gatherings with Friends and Family: Twice a week    Attends Religious Services: More than 4 times per year    Active Member of [employer] or Organizations: No    Attends Engineer, structural: Not on file    Marital Status: Separated  Intimate Partner Violence: Not on file    Family History  Problem Relation Age of Onset   Hypertension Mother    Diabetes Mother    Breast cancer Maternal Aunt        unsure of age   Breast cancer Cousin        unsure of age     No Known Allergies    Patient's last menstrual period was No LMP recorded. (Menstrual status: IUD)..           Review of Systems Alls systems reviewed and are negative.     Physical Exam Genitourinary:     Genitourinary Comments: Possible fibroids. Offered PUS if she would like to evaluate further     IUD strings visualized.     Uterus is enlarged and irregular.   13cm size IUD strings seen    A:         Well Woman GYN exam                             P:        Pap smear collected today Encouraged annual mammogram screening Colon cancer screening up-to-date Labs and immunizations to do with PMD Discussed breast self exams Encouraged healthy lifestyle practices Encouraged Vit D and Calcium    No follow-ups on file.  Barbara Thornton

## 2023-06-11 ENCOUNTER — Ambulatory Visit: Payer: Self-pay | Admitting: Obstetrics and Gynecology

## 2023-06-11 LAB — CYTOLOGY - PAP: Diagnosis: NEGATIVE

## 2023-06-14 ENCOUNTER — Other Ambulatory Visit: Payer: Self-pay | Admitting: Family Medicine

## 2023-06-14 DIAGNOSIS — E1165 Type 2 diabetes mellitus with hyperglycemia: Secondary | ICD-10-CM

## 2023-06-23 ENCOUNTER — Encounter (INDEPENDENT_AMBULATORY_CARE_PROVIDER_SITE_OTHER): Admitting: Adult Health

## 2023-07-01 ENCOUNTER — Other Ambulatory Visit: Payer: Self-pay | Admitting: Family Medicine

## 2023-07-01 DIAGNOSIS — I10 Essential (primary) hypertension: Secondary | ICD-10-CM

## 2023-09-19 LAB — HM DIABETES EYE EXAM

## 2023-11-10 ENCOUNTER — Encounter: Payer: Self-pay | Admitting: Family Medicine

## 2023-11-10 ENCOUNTER — Other Ambulatory Visit: Payer: Self-pay

## 2023-11-10 ENCOUNTER — Ambulatory Visit: Payer: Self-pay | Admitting: Family Medicine

## 2023-11-10 ENCOUNTER — Ambulatory Visit: Admitting: Family Medicine

## 2023-11-10 VITALS — BP 128/78 | HR 85 | Temp 98.0°F | Resp 16 | Ht 62.0 in | Wt 204.0 lb

## 2023-11-10 DIAGNOSIS — E1165 Type 2 diabetes mellitus with hyperglycemia: Secondary | ICD-10-CM | POA: Diagnosis not present

## 2023-11-10 DIAGNOSIS — Z7985 Long-term (current) use of injectable non-insulin antidiabetic drugs: Secondary | ICD-10-CM

## 2023-11-10 DIAGNOSIS — D649 Anemia, unspecified: Secondary | ICD-10-CM

## 2023-11-10 DIAGNOSIS — Z0000 Encounter for general adult medical examination without abnormal findings: Secondary | ICD-10-CM

## 2023-11-10 LAB — COMPREHENSIVE METABOLIC PANEL WITH GFR
ALT: 11 U/L (ref 0–35)
AST: 13 U/L (ref 0–37)
Albumin: 4.2 g/dL (ref 3.5–5.2)
Alkaline Phosphatase: 51 U/L (ref 39–117)
BUN: 15 mg/dL (ref 6–23)
CO2: 29 meq/L (ref 19–32)
Calcium: 8.8 mg/dL (ref 8.4–10.5)
Chloride: 103 meq/L (ref 96–112)
Creatinine, Ser: 0.76 mg/dL (ref 0.40–1.20)
GFR: 92.89 mL/min (ref >60.00)
Glucose, Bld: 106 mg/dL — ABNORMAL HIGH (ref 70–99)
Potassium: 4.4 meq/L (ref 3.5–5.1)
Sodium: 139 meq/L (ref 135–145)
Total Bilirubin: 0.5 mg/dL (ref 0.2–1.2)
Total Protein: 6.9 g/dL (ref 6.0–8.3)

## 2023-11-10 LAB — CBC
HCT: 36.8 % (ref 36.0–46.0)
Hemoglobin: 11.9 g/dL — ABNORMAL LOW (ref 12.0–15.0)
MCHC: 32.4 g/dL (ref 30.0–36.0)
MCV: 84.4 fl (ref 78.0–100.0)
Platelets: 250 K/uL (ref 150.0–400.0)
RBC: 4.36 Mil/uL (ref 3.87–5.11)
RDW: 14.3 % (ref 11.5–15.5)
WBC: 5.1 K/uL (ref 4.0–10.5)

## 2023-11-10 LAB — LIPID PANEL
Cholesterol: 88 mg/dL (ref 0–200)
HDL: 43.8 mg/dL (ref >39.00)
LDL Cholesterol: 33 mg/dL (ref 0–99)
NonHDL: 44.5
Total CHOL/HDL Ratio: 2
Triglycerides: 59 mg/dL (ref 0.0–149.0)
VLDL: 11.8 mg/dL (ref 0.0–40.0)

## 2023-11-10 LAB — HEMOGLOBIN A1C: Hgb A1c MFr Bld: 6.3 % (ref 4.6–6.5)

## 2023-11-10 MED ORDER — TIRZEPATIDE 5 MG/0.5ML ~~LOC~~ SOAJ: 5.0000 mg | SUBCUTANEOUS | 0 refills | Status: AC

## 2023-11-10 MED ORDER — TIRZEPATIDE 7.5 MG/0.5ML ~~LOC~~ SOAJ: 7.5000 mg | SUBCUTANEOUS | 0 refills | Status: DC

## 2023-11-10 MED ORDER — TIRZEPATIDE 2.5 MG/0.5ML ~~LOC~~ SOAJ: 2.5000 mg | SUBCUTANEOUS | 0 refills | Status: AC

## 2023-11-10 NOTE — Progress Notes (Signed)
 Chief Complaint  Patient presents with   Annual Exam    CPE     Well Woman Barbara Thornton is here for a complete physical.   Her last physical was >1 year ago.  Current diet: in general, a healthy diet. Current exercise: lifting wts ,cardio. Weight is intentionally decreasing and she denies fatigue out of ordinary. Seatbelt? Yes Advanced directive? No  Health Maintenance Pap/HPV- Yes Mammogram- Due Tetanus- Yes Hep C screening- Yes HIV screening- Yes  Past Medical History:  Diagnosis Date   Anemia    Diabetes mellitus without complication (HCC)    DVT (deep venous thrombosis) (HCC) 01/07/2016   right leg, from BCP's then blood thinners used, now IUD to prevent clotting   Elevated cholesterol    Hx of adenomatous colonic polyps 05/23/2021   2 diminutive adenomas-recall 2030   Hypertension      Past Surgical History:  Procedure Laterality Date   BREAST REDUCTION SURGERY Bilateral 07/23/2022   Procedure: BREAST REDUCTION WITH LIPOSUCTION;  Surgeon: Lowery Estefana RAMAN, DO;  Location: [facility];  Service: Plastics;  Laterality: Bilateral;  2.5 HOURS NEEDED   CESAREAN SECTION  1999   epidural   INTRAUTERINE DEVICE INSERTION  01/29/2016   Mirena    TUBAL LIGATION  1999    Medications  Current Outpatient Medications on File Prior to Visit  Medication Sig Dispense Refill   levonorgestrel  (MIRENA ) 20 MCG/24HR IUD 1 each by Intrauterine route once.     metFORMIN  (GLUCOPHAGE ) 1000 MG tablet Take 1 tablet (1,000 mg total) by mouth 2 (two) times daily with a meal. 180 tablet 3   olmesartan  (BENICAR ) 20 MG tablet Take 1 tablet (20 mg total) by mouth daily. 90 tablet 1   rosuvastatin  (CRESTOR ) 20 MG tablet TAKE 1 TABLET BY MOUTH EVERY DAY 90 tablet 3   Allergies No Known Allergies  Review of Systems: Constitutional:  no unexpected weight changes Eye:  no recent significant change in vision Ear/Nose/Mouth/Throat:  Ears:  no recent change in  hearing Nose/Mouth/Throat:  no complaints of nasal congestion, no sore throat Cardiovascular: no chest pain Respiratory:  no shortness of breath Gastrointestinal:  no abdominal pain, no change in bowel habits GU:  Female: negative for dysuria or pelvic pain Musculoskeletal/Extremities:  no pain of the joints Integumentary (Skin/Breast):  no abnormal skin lesions reported Neurologic:  no headaches Endocrine:  denies fatigue Hematologic/Lymphatic:  No areas of easy bleeding  Exam BP 128/78 (BP Location: Left Arm, Patient Position: Sitting)   Pulse 85   Temp 98 F (36.7 C) (Oral)   Resp 16   Ht 5' 2 (1.575 m)   Wt 204 lb (92.5 kg)   SpO2 99%   BMI 37.31 kg/m  General:  well developed, well nourished, in no apparent distress Skin:  no significant moles, warts, or growths Head:  no masses, lesions, or tenderness Eyes:  pupils equal and round, sclera anicteric without injection Ears:  canals without lesions, TMs shiny without retraction, no obvious effusion, no erythema Nose:  nares patent, mucosa normal, and no drainage Throat/Pharynx:  lips and gingiva without lesion; tongue and uvula midline; non-inflamed pharynx; no exudates or postnasal drainage Neck: neck supple without adenopathy, thyromegaly, or masses Lungs:  clear to auscultation, breath sounds equal bilaterally, no respiratory distress Cardio:  regular rate and rhythm, no LE edema Abdomen:  abdomen soft, nontender; bowel sounds normal; no masses or organomegaly Genital: Defer to GYN Musculoskeletal:  symmetrical muscle groups noted without atrophy or deformity Extremities:  no clubbing, cyanosis, or edema, no deformities, no skin discoloration Neuro:  gait normal; deep tendon reflexes normal and symmetric Psych: well oriented with normal range of affect and appropriate judgment/insight  Assessment and Plan  Well adult exam - Plan: CBC, Comprehensive metabolic panel with GFR, Lipid panel, Hepatitis B surface  antibody,quantitative  Type 2 diabetes mellitus with hyperglycemia, without long-term current use of insulin (HCC) - Plan: Hemoglobin A1c, tirzepatide  (MOUNJARO ) 2.5 MG/0.5ML Pen, tirzepatide  (MOUNJARO ) 5 MG/0.5ML Pen, tirzepatide  (MOUNJARO ) 7.5 MG/0.5ML Pen   Well 48 y.o. female. Counseled on diet and exercise. Check A1c. Screen hep B. Advanced directive form provided today.  Flu shot politely declined. Other orders as above. Follow up in 6 mo. The patient voiced understanding and agreement to the plan.  Mabel [place], DO 11/10/23 8:58 AM

## 2023-11-10 NOTE — Patient Instructions (Addendum)
 Give [place] 2-3 business days to get the results of your labs back.   Keep the diet clean and stay active.  Please get me a copy of your advanced directive form at your convenience.   Let us know if you need anything.

## 2023-11-11 LAB — HEPATITIS B SURFACE ANTIBODY, QUANTITATIVE: Hep B S AB Quant (Post): <5 m[IU]/mL — ABNORMAL LOW (ref >=10)

## 2023-11-12 NOTE — Telephone Encounter (Signed)
 Hep B appt scheduled, spoke with pt.

## 2023-11-26 ENCOUNTER — Other Ambulatory Visit (INDEPENDENT_AMBULATORY_CARE_PROVIDER_SITE_OTHER)

## 2023-11-26 ENCOUNTER — Ambulatory Visit: Payer: Self-pay | Admitting: Family Medicine

## 2023-11-26 ENCOUNTER — Ambulatory Visit (INDEPENDENT_AMBULATORY_CARE_PROVIDER_SITE_OTHER)

## 2023-11-26 DIAGNOSIS — Z23 Encounter for immunization: Secondary | ICD-10-CM

## 2023-11-26 DIAGNOSIS — D649 Anemia, unspecified: Secondary | ICD-10-CM

## 2023-11-26 LAB — CBC WITH DIFFERENTIAL/PLATELET
Basophils Absolute: 0 K/uL (ref 0.0–0.1)
Basophils Relative: 0.8 % (ref 0.0–3.0)
Eosinophils Absolute: 0.1 K/uL (ref 0.0–0.7)
Eosinophils Relative: 1.3 % (ref 0.0–5.0)
HCT: 38.3 % (ref 36.0–46.0)
Hemoglobin: 12.4 g/dL (ref 12.0–15.0)
Lymphocytes Relative: 43.6 % (ref 12.0–46.0)
Lymphs Abs: 2.3 K/uL (ref 0.7–4.0)
MCHC: 32.3 g/dL (ref 30.0–36.0)
MCV: 84.4 fl (ref 78.0–100.0)
Monocytes Absolute: 0.4 K/uL (ref 0.1–1.0)
Monocytes Relative: 8.4 % (ref 3.0–12.0)
Neutro Abs: 2.4 K/uL (ref 1.4–7.7)
Neutrophils Relative %: 45.9 % (ref 43.0–77.0)
Platelets: 259 K/uL (ref 150.0–400.0)
RBC: 4.54 Mil/uL (ref 3.87–5.11)
RDW: 13.7 % (ref 11.5–15.5)
WBC: 5.3 K/uL (ref 4.0–10.5)

## 2023-11-26 LAB — IBC + FERRITIN
Ferritin: 60.8 ng/mL (ref 10.0–291.0)
Iron: 62 ug/dL (ref 42–145)
Saturation Ratios: 15.6 % — ABNORMAL LOW (ref 20.0–50.0)
TIBC: 396.2 ug/dL (ref 250.0–450.0)
Transferrin: 283 mg/dL (ref 212.0–360.0)

## 2023-11-26 NOTE — Progress Notes (Signed)
 Patient here for Hep B as ordered by pcp on lab results from 11/10/2023. Patient scheduled for second vaccine 12/29/23

## 2023-12-24 ENCOUNTER — Other Ambulatory Visit: Payer: Self-pay | Admitting: Family Medicine

## 2023-12-24 DIAGNOSIS — I10 Essential (primary) hypertension: Secondary | ICD-10-CM

## 2023-12-29 ENCOUNTER — Ambulatory Visit (INDEPENDENT_AMBULATORY_CARE_PROVIDER_SITE_OTHER)

## 2023-12-29 DIAGNOSIS — Z23 Encounter for immunization: Secondary | ICD-10-CM

## 2023-12-29 NOTE — Progress Notes (Signed)
 Pt here today for Heplisav-B  per Dr. Frann.   Heplisav-B  0.43mL injected into R deltoid IM. Pt tolerated injection well.

## 2024-01-16 ENCOUNTER — Encounter: Payer: Self-pay | Admitting: Obstetrics and Gynecology

## 2024-01-16 DIAGNOSIS — N926 Irregular menstruation, unspecified: Secondary | ICD-10-CM

## 2024-01-18 NOTE — Telephone Encounter (Signed)
 Routing to Dr. Glennon to advise

## 2024-01-19 NOTE — Telephone Encounter (Signed)
 U/s & office visit with Dr Glennon on 02-24-24.

## 2024-01-19 NOTE — Telephone Encounter (Signed)
 Per patient she has stopped bleeding and would like to have u/s done at our office instead. Message sent to scheduling department to call her to schedule u/s & consult with Dr Glennon. Order placed for u/s

## 2024-01-22 ENCOUNTER — Other Ambulatory Visit: Payer: Self-pay | Admitting: Family Medicine

## 2024-01-22 ENCOUNTER — Encounter: Payer: Self-pay | Admitting: Family Medicine

## 2024-01-22 DIAGNOSIS — E1165 Type 2 diabetes mellitus with hyperglycemia: Secondary | ICD-10-CM

## 2024-01-22 MED ORDER — TIRZEPATIDE 7.5 MG/0.5ML ~~LOC~~ SOAJ: 7.5000 mg | SUBCUTANEOUS | 1 refills | Status: AC

## 2024-02-23 ENCOUNTER — Encounter: Payer: Self-pay | Admitting: Obstetrics and Gynecology

## 2024-02-24 ENCOUNTER — Other Ambulatory Visit

## 2024-02-24 ENCOUNTER — Other Ambulatory Visit: Admitting: Obstetrics and Gynecology

## 2024-05-10 ENCOUNTER — Ambulatory Visit: Admitting: Family Medicine
